# Patient Record
Sex: Male | Born: 1955
Health system: Southern US, Community
[De-identification: ages and names within clinical notes are randomized; demographics above are authoritative.]

## PROBLEM LIST (undated history)

## (undated) DIAGNOSIS — G243 Spasmodic torticollis: Secondary | ICD-10-CM

## (undated) DIAGNOSIS — R7401 Elevation of levels of liver transaminase levels: Secondary | ICD-10-CM

## (undated) DIAGNOSIS — R740 Nonspecific elevation of levels of transaminase and lactic acid dehydrogenase [LDH]: Principal | ICD-10-CM

## (undated) HISTORY — DX: Elevation of levels of liver transaminase levels: R74.01

## (undated) HISTORY — DX: Spasmodic torticollis: G24.3

## (undated) HISTORY — DX: Nonspecific elevation of levels of transaminase and lactic acid dehydrogenase (ldh): R74.0

---
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
## 2012-12-20 ENCOUNTER — Encounter: Payer: Self-pay | Admitting: Neurology

## 2012-12-20 ENCOUNTER — Ambulatory Visit (INDEPENDENT_AMBULATORY_CARE_PROVIDER_SITE_OTHER): Payer: BC Managed Care – PPO | Admitting: Neurology

## 2012-12-20 VITALS — BP 136/75 | HR 67 | Temp 97.9°F | Resp 16 | Ht 69.0 in | Wt 215.8 lb

## 2012-12-20 DIAGNOSIS — G243 Spasmodic torticollis: Secondary | ICD-10-CM

## 2012-12-20 NOTE — Progress Notes (Signed)
Patient seen today for IPSEN 170 Visit 7 End of Study.  No changes in conmeds or Medical History.  Vital signs and labs were done per protocol.  PE and TWSTRS was completed by Dr. Terrace Arabia.     Clinical history: Andrew Bentley reported a history of abnormal neck posture for 10 years, much profounced since 2008, he brought three sets of pictures to establish the time line of his condition, pictures were taken after his [name], dated 2008, 06/04/2008, 2012, all three pictures have consistently demonstrated the significant right neck tilt.  He has received dysport injection, enrolled in open label study. Last injection was Nov 15th 2013.   Physical Exam  General: normal Head: normal cephalic Ears, Nose and Throat: normal oropharyngeal Neck: supple no carotid bruits Respiratory: clear to auscultation bilaterally Cardiovascular: regular rate rhythm Musculoskeletal: normal Skin: normal Trunk: normal  Neurologic Exam  Mental Status: pleasant, awake, alert, cooperative to history, talking, and casual conversation. He has constant mild left turn, mild right tilt, slight retrocollis, mild left shoulder elevation, left lateral shift. TWSTRS scale, see separate report.  Cranial Nerves: CN II-XII pupils were equal round reactive to light.    Extraocular movements were full.   Facial sensation and strength were normal.      Motor: move all four extremities without difficulty. Sensory: Normal to light touch,  Coordination:  There was no dysmetria noticed. Gait and Station: Narrow based and steady,  Reflexes: Deep tendon reflexes: Biceps: 2/2, Brachioradialis: 2/2, Triceps: 2/2, Pateller: 2/2, Achilles: 2/2.  Plantar responses are flexor.   He responded very well to EMG guided dysport injection, no significant side effect, we will proceed further injection through his insurance company

## 2012-12-21 DIAGNOSIS — G243 Spasmodic torticollis: Secondary | ICD-10-CM | POA: Insufficient documentation

## 2013-02-13 ENCOUNTER — Encounter: Payer: Self-pay | Admitting: Neurology

## 2013-02-13 NOTE — Telephone Encounter (Signed)
  Clinical history: Andrew Bentley reported a history of abnormal neck posture for 10 years, much profounced since 2008, he brought three sets of pictures to establish the time line of his condition, pictures were taken after his [name], dated 2008, 06/04/2008, 2012, all three pictures have consistently demonstrated the significant right neck tilt.  Patient was enrolled in IPSEN clinical trial. He received EMG guided this port injection December 29 2012, had more than 15% improvement in forced skull on his followup visit in main fifteens, he had a second injection April 04 2012, third injection October 18th 2013, last injection was January 4th 2014  Physical Exam  Neck: supple no carotid bruits Respiratory: clear to auscultation bilaterally Cardiovascular: regular rate rhythm  Neurologic Exam  Mental Status: pleasant, awake, alert, cooperative to history, talking, and casual conversation.  He has constant moderate left turn, right tilt, mild retrocollis, left shoulder elevation. TWSTRS scale, see separate report. Cranial Nerves: CN II-XII pupils were equal round reactive to light.  Fundi were sharp bilaterally.  Extraocular movements were full.  Visual fields were full on confrontational test.  Facial sensation and strength were normal.  Hearing was intact to finger rubbing bilaterally.  Uvula tongue were midline.    Tongue protrusion into the cheeks strength were normal.  Motor: Normal tone, bulk, and strength. Sensory: Normal to light touch, pinprick, proprioception, and vibratory sensation. Coordination: Normal finger-to-nose, heel-to-shin.  There was no dysmetria noticed. Gait and Station: Narrow based and steady, was able to perform tiptoe, heel, and tandem walking without difficulty.  Romberg sign: Negative Reflexes: Deep tendon reflexes: Biceps: 2/2, Brachioradialis: 2/2, Triceps: 2/2, Pateller: 2/2, Achilles: 2/2.  Plantar responses are flexor.   Assessment and Plan The provided dysport vs  placebo was dissovled into 2 cc of NS.  Under EMG guidance, the injection was placed  at  Right splenius capitis 25 units Left splenius capitis 25 units Left longissimus capitis  25 units Right sternocleidomastoid 25 units.    I have wrote letter of medical necessity for continued EMG guided Botox injection

## 2013-02-20 NOTE — Telephone Encounter (Signed)
Error

## 2013-05-12 ENCOUNTER — Telehealth: Payer: Self-pay | Admitting: Neurology

## 2013-05-12 NOTE — Telephone Encounter (Signed)
error 

## 2013-05-17 NOTE — Telephone Encounter (Signed)
error 

## 2014-05-10 ENCOUNTER — Ambulatory Visit: Payer: BC Managed Care – PPO | Admitting: Internal Medicine

## 2014-06-26 ENCOUNTER — Telehealth: Payer: Self-pay

## 2014-06-26 NOTE — Telephone Encounter (Signed)
Andrew Bentley received a call from patient asking about Botox.  He sees Dr. Terrace ArabiaYan and would like to discuss using Botox or enrolling in a study possibly.  I called patient this morning to get details but had to leave a message on AM. I left my number and asked him to return the call.

## 2014-07-11 ENCOUNTER — Telehealth: Payer: Self-pay

## 2014-07-11 NOTE — Telephone Encounter (Signed)
This patient was denied by his insurance and I appealed it which also resulted in a denial.

## 2014-07-11 NOTE — Telephone Encounter (Signed)
Dr.Yan this patient will need appt. Please see note below. You have to do whole process over . Patient has been denied for the codes you used before. This patient will have to start from scratch. I can put him on your sch. Next available.

## 2014-07-11 NOTE — Telephone Encounter (Signed)
Denese KillingsJanisha, I will redo preauthorization, Let him come after approval  Cervical dystonia, Xeomin 300 units.

## 2014-07-11 NOTE — Telephone Encounter (Signed)
Returned patient's call.  He is interested in receiving Botox for Torticollis.  He states that he was in a research study a couple of years ago and received Botox. He saw Dr. Terrace ArabiaYan at that time.  When the study ended, he wanted to continue with the Botox but was told his insurance, [employer], would not cover this.  He was quoted $1200 for injections (approximately) per quarter. He is wanting to know if we can check with his insurance carrier again to see if this might be covered as it is for a medical, not cosmetic, purpose.  He has the exact same policy as before. He also is interested in possibly participating in a study where Botox is given. I will check with Dr. Terrace ArabiaYan and forward this to the clinical person that handles Botox injections well to look at the insurance.

## 2014-07-30 ENCOUNTER — Telehealth: Payer: Self-pay | Admitting: Neurology

## 2014-07-30 NOTE — Telephone Encounter (Signed)
Andrew Bentley, please see the note from RiversideJanisha, please reschedule patient

## 2014-07-30 NOTE — Telephone Encounter (Signed)
-----   Message from Eugenie BirksJanisha R Cooper sent at 07/30/2014 10:31 AM EST ----- This patient was denied Xeomin Prior Authorization due last appointment being 12/20/12. He would have to be evaluated and resubmitted.

## 2014-07-30 NOTE — Telephone Encounter (Signed)
Patient is not On Dr.Yan's schedule.

## 2014-08-24 ENCOUNTER — Telehealth: Payer: Self-pay | Admitting: Neurology

## 2014-08-24 NOTE — Telephone Encounter (Signed)
Sandy/Tashia:  Please give pt a follow up appt in my next available.

## 2014-08-27 NOTE — Telephone Encounter (Signed)
Patient is scheduled with Dr. Terrace ArabiaYan

## 2014-08-28 ENCOUNTER — Telehealth: Payer: Self-pay | Admitting: Neurology

## 2014-08-28 ENCOUNTER — Encounter: Payer: Self-pay | Admitting: Neurology

## 2014-08-28 ENCOUNTER — Ambulatory Visit (INDEPENDENT_AMBULATORY_CARE_PROVIDER_SITE_OTHER): Payer: BC Managed Care – PPO | Admitting: Neurology

## 2014-08-28 VITALS — BP 135/87 | HR 80 | Ht 69.0 in | Wt 220.0 lb

## 2014-08-28 DIAGNOSIS — G243 Spasmodic torticollis: Secondary | ICD-10-CM

## 2014-08-28 NOTE — Progress Notes (Signed)
PATIENT: Andrew Bentley DOB: [birth year]  HISTORICAL  Andrew Bentley is a 58 years old right-handed Caucasian male, came back for revisit, for potential EMG guided Botox injection for his cervical dystonia  He had a gradual onset neck turning to the left side, abnormal neck posture since 2005, progressively worse since 2008, he was enrolled in Dysport research trial sponsored by [employer] since 2013, he totally received 4 injection, last injection was November 2013, complete the study successfully in April 2014, which is his last office visit.  During the trial, he receive EMG guided dysport injection every 3 months, 500 units, he tolerated the injection very well, showed significant improvement of his neck posturing  He continue to works out regularly, he denies gait difficulty, he complains of left occipital area radiating pain to his left parietal area, he denies radiating pain from his neck to his arms, and hands  REVIEW OF SYSTEMS: Full 14 system review of systems performed and notable only for as above  ALLERGIES: No Known Allergies  HOME MEDICATIONS: No current outpatient prescriptions on file prior to visit.   No current facility-administered medications on file prior to visit.    PAST MEDICAL HISTORY: Past Medical History  Diagnosis Date  . Cervical dystonia     PAST SURGICAL HISTORY: Past Surgical History  Procedure Laterality Date  . No past surgeries      FAMILY HISTORY: Family History  Problem Relation Age of Onset  . Bipolar disorder Mother   . Dementia Mother   . Parkinson's disease Mother   . Memory loss Father     SOCIAL HISTORY:  History   Social History  . Marital Status: Single    Spouse Name: N/A    Number of Children: 0  . Years of Education: college   Occupational History  .      Self Emp.   Social History Main Topics  . Smoking status: Never Smoker   . Smokeless tobacco: Never Used  . Alcohol Use: 1.2 oz/week    2 Glasses of wine per  week     Comment: Red wine with dinner  . Drug Use: No  . Sexual Activity: Not on file   Other Topics Concern  . Not on file   Social History Narrative   Patient lives at home with his wife Andrew Bentley.    Patient works full time [employer].   Education college education.   Right handed.   Caffeine two cups of coffee daily.     PHYSICAL EXAM   Filed Vitals:   08/28/14 1338  BP: 135/87  Pulse: 80  Height: 5\' 9"  (1.753 m)  Weight: 220 lb (99.791 kg)    Not recorded      Body mass index is 32.47 kg/(m^2).   Generalized: In no acute distress  Neck: Supple, no carotid bruits   Cardiac: Regular rate rhythm  Pulmonary: Clear to auscultation bilaterally  Musculoskeletal: No deformity  Neurological examination  Mentation: Alert oriented to time, place, history taking, and causual conversation, he has left neck turning, Almost 90, mild retrocollis mild left tilt,   Cranial nerve II-XII: Pupils were equal round reactive to light. Extraocular movements were full.  Visual field were full on confrontational test. Bilateral fundi were sharp.  Facial sensation and strength were normal. Hearing was intact to finger rubbing bilaterally. Uvula tongue midline.  Head turning and shoulder shrug and were normal and symmetric.Tongue protrusion into cheek strength was normal.  Motor: Normal tone, bulk and strength.  Sensory: Intact to fine touch, pinprick, preserved vibratory sensation, and proprioception at toes.  Coordination: Normal finger to nose, heel-to-shin bilaterally there was no truncal ataxia  Gait: Rising up from seated position without assistance, normal stance, without trunk ataxia, moderate stride, good arm swing, smooth turning, able to perform tiptoe, and heel walking without difficulty.   Romberg signs: Negative  Deep tendon reflexes: Brachioradialis 2/2, biceps 2/2, triceps 2/2, patellar 2/2, Achilles 2/2, plantar responses were flexor bilaterally.   DIAGNOSTIC DATA  (LABS, IMAGING, TESTING) - I reviewed patient records, labs, notes, testing and imaging myself where available.  ASSESSMENT AND PLAN  Andrew Bentley is a 58 y.o. male with long-standing history of cervical dystonia, responded very well to previous EMG guided dysport injection.  Start preauthorization process  Return to clinic in 2 weeks for potential injection     Levert FeinsteinYijun Tywon Niday, M.D. Ph.D.  [facility] [address], [address] [place], [place] [address] [phone]

## 2014-08-28 NOTE — Telephone Encounter (Signed)
Andrew KillingsJanisha, if he is approved for Dysport, please help him with medicine assistant program

## 2014-09-04 ENCOUNTER — Telehealth: Payer: Self-pay | Admitting: Neurology

## 2014-09-04 NOTE — Telephone Encounter (Signed)
Called patient and informed him that his insurance would not cover either medication. Quoted him some different prices with the different medications for out of pocket. He verbalized understanding.

## 2014-09-19 ENCOUNTER — Telehealth: Payer: Self-pay | Admitting: Neurology

## 2014-09-19 NOTE — Telephone Encounter (Signed)
Andrew Bentley with [facility] @ [phone] x [phone], unable to reach patient and needing to verify if shipment for Rx DYSPORT should be forwarded to office.  Please call and advise, if not available please leave physical address if shipment needs to be shipped to office.

## 2014-09-27 ENCOUNTER — Encounter: Payer: Self-pay | Admitting: Internal Medicine

## 2014-09-27 ENCOUNTER — Ambulatory Visit (INDEPENDENT_AMBULATORY_CARE_PROVIDER_SITE_OTHER): Payer: BLUE CROSS/BLUE SHIELD | Admitting: Internal Medicine

## 2014-09-27 VITALS — BP 130/72 | HR 80 | Temp 99.0°F | Resp 10 | Ht 69.0 in | Wt 219.0 lb

## 2014-09-27 DIAGNOSIS — Z131 Encounter for screening for diabetes mellitus: Secondary | ICD-10-CM

## 2014-09-27 DIAGNOSIS — Z13220 Encounter for screening for lipoid disorders: Secondary | ICD-10-CM

## 2014-09-27 DIAGNOSIS — R740 Nonspecific elevation of levels of transaminase and lactic acid dehydrogenase [LDH]: Secondary | ICD-10-CM

## 2014-09-27 DIAGNOSIS — G243 Spasmodic torticollis: Secondary | ICD-10-CM

## 2014-09-27 DIAGNOSIS — R7401 Elevation of levels of liver transaminase levels: Secondary | ICD-10-CM | POA: Insufficient documentation

## 2014-09-27 NOTE — Progress Notes (Signed)
Patient ID: Andrew Bentley, male   DOB: Jun 13, 1956, 59 y.o.   MRN: [id number]   Location:  [facility]  Code Status: full code;  Does not want to talk about advance directives  No Known Allergies  Chief Complaint  Patient presents with  . Establish Care    New patient establish care, history of elevated liver enzyme (not fasting today). Address cervical dystonia    HPI: Patient is a 59 y.o. white male seen in the office today to establish with the practice.    Has cervical dystonia and sees Dr. Terrace Arabia for botox injections for that.  Has spasms on right side, anteriorly and posterior left side of neck.  May now be covered by insurance.  Works out 2-3 times a week.  Has personal trainer--does upper body, lower body, a little cardio.  Runs some on treadmill.  Gets heart rate up to 150.    Last physical, Dr. Lucianne Muss noticed he had elevated liver enzymes--has been seeing him since 57s.  Has 2 glasses red wine per night.  Does like it.  Has tried to cut back to 1, but has not really done this.  Also drinks a lot of coffee--[employer]bucks 2-3 cups per day mostly in the am.    Has cut out sodas.  Only drinks bottled water.    Is taking nerium EHT mind enhancement formula.    Review of Systems:  Review of Systems  Constitutional:       Stable weight for years, but would like to lose  HENT: Negative for congestion and hearing loss.   Eyes: Negative for blurred vision.       Knows he needs to see optometrist; does not wear any corrective lenses  Respiratory: Negative for shortness of breath.   Cardiovascular: Negative for chest pain.  Gastrointestinal: Positive for heartburn. Negative for constipation, blood in stool and melena.  Genitourinary: Negative for dysuria, urgency and frequency.  Musculoskeletal: Positive for neck pain. Negative for falls.  Skin: Positive for rash. Negative for itching.       callousing of knees  Neurological: Positive for  tingling. Negative for dizziness, sensory change, loss of consciousness and headaches.       In fingers in left hand attributed from neck  Endo/Heme/Allergies: Positive for environmental allergies. Does not bruise/bleed easily.       More sinus issues as aging over past couple years  Psychiatric/Behavioral: Negative for depression and memory loss.       Forgets where puts things occasionally     Past Medical History  Diagnosis Date  . Cervical dystonia     Past Surgical History  Procedure Laterality Date  . Tonsillectomy  1963    Social History:   reports that he has never smoked. He has never used smokeless tobacco. He reports that he drinks about 1.2 oz of alcohol per week. He reports that he does not use illicit drugs.  Family History  Problem Relation Age of Onset  . Bipolar disorder Mother   . Dementia Mother   . Parkinson's disease Mother   . Memory loss Father     Medications: Patient's Medications  New Prescriptions   No medications on file  Previous Medications   OVER THE COUNTER MEDICATION    Nerium EHT mind enhancement formula, 1 by mouth daily  Modified Medications   No medications on file  Discontinued Medications   No medications on file     Physical Exam: Filed Vitals:  09/27/14 0850  BP: 130/72  Pulse: 80  Temp: 99 F (37.2 C)  TempSrc: Oral  Resp: 10  Height: 5\' 9"  (1.753 m)  Weight: 219 lb (99.338 kg)  SpO2: 99%  Physical Exam  Constitutional: He is oriented to person, place, and time. He appears well-developed and well-nourished. No distress.  Cardiovascular: Normal rate, regular rhythm, normal heart sounds and intact distal pulses.   Pulmonary/Chest: Effort normal and breath sounds normal. No respiratory distress.  Abdominal: Bowel sounds are normal.  Musculoskeletal:  Right SCM tight;  Left cervical paravertebral muscles also tight and left SCM tender;  Slight decrease in rotation right  Neurological: He is alert and oriented to  person, place, and time.  Skin:  callousing of bilateral knees  Psychiatric: He has a normal mood and affect. Judgment and thought content normal.  fidgety    Labs reviewed: None available--await records (pt completed release)  Assessment/Plan 1. Transaminitis - advised to limit his wine intake (talks about this a lot suggesting more than the 2 glasses/night he admits to) -check labs: - CBC With differential/Platelet; Future - Basic metabolic panel; Future - Hepatic Function Panel; Future  2. Cervical dystonia -keep f/u with Dr. Terrace ArabiaYan for botox injections  3. Screening, lipid - sounds like he enjoys fried foods--is overweight - Lipid panel; Future  4. Encounter for screening examination for impaired glucose regulation and diabetes mellitus - will assess for diabetes - Hemoglobin A1c; Future  Labs/tests ordered:   Orders Placed This Encounter  Procedures  . CBC With differential/Platelet    Standing Status: Future     Number of Occurrences:      Standing Expiration Date: 12/27/2014  . Lipid panel    Standing Status: Future     Number of Occurrences:      Standing Expiration Date: 12/27/2014    Order Specific Question:  Has the patient fasted?    Answer:  Yes  . Hemoglobin A1c    Standing Status: Future     Number of Occurrences:      Standing Expiration Date: 12/27/2014  . Basic metabolic panel    Standing Status: Future     Number of Occurrences:      Standing Expiration Date: 12/27/2014    Order Specific Question:  Has the patient fasted?    Answer:  Yes  . Hepatic Function Panel    Standing Status: Future     Number of Occurrences:      Standing Expiration Date: 12/27/2014   Next appt:  3 mos  Tifany Hirsch L. Daleysa Kristiansen, D.O. Geriatrics [facility] [address] [place], [place] [address] Cell Phone (Mon-Fri 8am-5pm):  [phone] On Call:  [phone] & follow prompts after 5pm & weekends Office Phone:  [phone] Office Fax:   [phone]

## 2014-10-02 ENCOUNTER — Other Ambulatory Visit: Payer: BLUE CROSS/BLUE SHIELD

## 2014-10-02 DIAGNOSIS — Z13220 Encounter for screening for lipoid disorders: Secondary | ICD-10-CM

## 2014-10-02 DIAGNOSIS — R7401 Elevation of levels of liver transaminase levels: Secondary | ICD-10-CM

## 2014-10-02 DIAGNOSIS — Z131 Encounter for screening for diabetes mellitus: Secondary | ICD-10-CM

## 2014-10-02 DIAGNOSIS — R740 Nonspecific elevation of levels of transaminase and lactic acid dehydrogenase [LDH]: Principal | ICD-10-CM

## 2014-10-03 ENCOUNTER — Encounter: Payer: Self-pay | Admitting: Neurology

## 2014-10-03 ENCOUNTER — Ambulatory Visit (INDEPENDENT_AMBULATORY_CARE_PROVIDER_SITE_OTHER): Payer: BLUE CROSS/BLUE SHIELD | Admitting: Neurology

## 2014-10-03 ENCOUNTER — Encounter: Payer: Self-pay | Admitting: *Deleted

## 2014-10-03 DIAGNOSIS — G243 Spasmodic torticollis: Secondary | ICD-10-CM

## 2014-10-03 LAB — CBC WITH DIFFERENTIAL
Basophils Absolute: 0 10*3/uL (ref 0.0–0.2)
Basos: 0 %
Eos: 2 %
Eosinophils Absolute: 0.1 10*3/uL (ref 0.0–0.4)
HCT: 44.4 % (ref 37.5–51.0)
Hemoglobin: 15 g/dL (ref 12.6–17.7)
Immature Grans (Abs): 0 10*3/uL (ref 0.0–0.1)
Immature Granulocytes: 0 %
Lymphocytes Absolute: 1.2 10*3/uL (ref 0.7–3.1)
Lymphs: 25 %
MCH: 31 pg (ref 26.6–33.0)
MCHC: 33.8 g/dL (ref 31.5–35.7)
MCV: 92 fL (ref 79–97)
Monocytes Absolute: 0.4 10*3/uL (ref 0.1–0.9)
Monocytes: 8 %
Neutrophils Absolute: 3.2 10*3/uL (ref 1.4–7.0)
Neutrophils Relative %: 65 %
Platelets: 298 10*3/uL (ref 150–379)
RBC: 4.84 x10E6/uL (ref 4.14–5.80)
RDW: 13.7 % (ref 12.3–15.4)
WBC: 4.9 10*3/uL (ref 3.4–10.8)

## 2014-10-03 LAB — BASIC METABOLIC PANEL
BUN/Creatinine Ratio: 11 (ref 9–20)
BUN: 13 mg/dL (ref 6–24)
CO2: 24 mmol/L (ref 18–29)
Calcium: 9.4 mg/dL (ref 8.7–10.2)
Chloride: 99 mmol/L (ref 97–108)
Creatinine, Ser: 1.14 mg/dL (ref 0.76–1.27)
GFR calc Af Amer: 83 mL/min/{1.73_m2} (ref >59)
GFR calc non Af Amer: 71 mL/min/{1.73_m2} (ref >59)
Glucose: 91 mg/dL (ref 65–99)
Potassium: 4.6 mmol/L (ref 3.5–5.2)
Sodium: 138 mmol/L (ref 134–144)

## 2014-10-03 LAB — LIPID PANEL
Chol/HDL Ratio: 3.1 ratio units (ref 0.0–5.0)
Cholesterol, Total: 172 mg/dL (ref 100–199)
HDL: 55 mg/dL (ref >39)
LDL Calculated: 99 mg/dL (ref 0–99)
Triglycerides: 90 mg/dL (ref 0–149)
VLDL Cholesterol Cal: 18 mg/dL (ref 5–40)

## 2014-10-03 LAB — HEPATIC FUNCTION PANEL
ALT: 14 IU/L (ref 0–44)
AST: 24 IU/L (ref 0–40)
Albumin: 4.2 g/dL (ref 3.5–5.5)
Alkaline Phosphatase: 61 IU/L (ref 39–117)
Bilirubin, Direct: 0.11 mg/dL (ref 0.00–0.40)
Total Bilirubin: 0.4 mg/dL (ref 0.0–1.2)
Total Protein: 6.7 g/dL (ref 6.0–8.5)

## 2014-10-03 LAB — HEMOGLOBIN A1C
Est. average glucose Bld gHb Est-mCnc: 108 mg/dL
Hgb A1c MFr Bld: 5.4 % (ref 4.8–5.6)

## 2014-10-03 MED ORDER — ABOBOTULINUMTOXINA 500 UNITS IM SOLR
500.0000 [IU] | Freq: Once | INTRAMUSCULAR | Status: AC
  Administered 2014-10-03: 500 [IU] via INTRAMUSCULAR

## 2014-10-03 NOTE — Progress Notes (Signed)
PATIENT: Nellie Chevalier DOB: October 21, 1955  HISTORICAL  Eoin Willden is a 59 years old right-handed Caucasian male, came back for revisit, for potential EMG guided Botox injection for his cervical dystonia  He had a gradual onset neck turning to the left side, abnormal neck posture since 2005, progressively worse since 2008, he was enrolled in Dysport research trial sponsored by [employer] since 2013, he totally received 4 injection, last injection was November 2013, complete the study successfully in April 2014, which is his last office visit.   During the trial, he receive EMG guided dysport injection every 3 months, 500 units, he tolerated the injection very well, showed significant improvement of his neck posturing  He continue to works out regularly, he denies gait difficulty, he complains of left occipital area radiating pain to his left parietal area, he denies radiating pain from his neck to his arms, and hands  UPDATE Jan 20th 2016: He signed a consent form, also documented under communication dated October 03 2014, potential side effect explained, he had a bulky right sternocleidomastoid, left posterior cervical paraspinal muscles.  REVIEW OF SYSTEMS: Full 14 system review of systems performed and notable only for as above  ALLERGIES: No Known Allergies  HOME MEDICATIONS: Current Outpatient Prescriptions on File Prior to Visit  Medication Sig Dispense Refill  . OVER THE COUNTER MEDICATION Nerium EHT mind enhancement formula, 1 by mouth daily     No current facility-administered medications on file prior to visit.    PAST MEDICAL HISTORY: Past Medical History  Diagnosis Date  . Cervical dystonia   . Transaminitis     PAST SURGICAL HISTORY: Past Surgical History  Procedure Laterality Date  . Tonsillectomy  1963    FAMILY HISTORY: Family History  Problem Relation Age of Onset  . Bipolar disorder Mother   . Dementia Mother   . Parkinson's disease Mother   . Memory loss  Father     SOCIAL HISTORY:  History   Social History  . Marital Status: Single    Spouse Name: N/A    Number of Children: 0  . Years of Education: college   Occupational History  .      Self [employer].   Social History Main Topics  . Smoking status: Never Smoker   . Smokeless tobacco: Never Used  . Alcohol Use: 1.2 oz/week    2 Glasses of wine per week     Comment: Red wine with dinner  . Drug Use: No  . Sexual Activity: Not on file   Other Topics Concern  . Not on file   Social History Narrative   Patient lives at home (one stories) with his wife [name], married since 1985    Patient works full time Market researcher.  [employer]   Education college education.   Right handed.   Caffeine two cups of coffee daily.   2 dogs, 1 cat   Exercises      PHYSICAL EXAM   Filed Vitals:    Not recorded      Cannot calculate BMI with a height equal to zero.   Generalized: In no acute distress  Neck: Supple, no carotid bruits   Cardiac: Regular rate rhythm  Pulmonary: Clear to auscultation bilaterally  Musculoskeletal: No deformity  Neurological examination  Mentation: Alert oriented to time, place, history taking, and causual conversation, he has left neck turning, Almost 90, mild retrocollis mild left tilt, bulky right sternocleidomastoid, left posterior cervical paraspinal muscles, limited range of turning, especially  when turning to the left side  Cranial nerve II-XII: Pupils were equal round reactive to light. Extraocular movements were full.  Visual field were full on confrontational test. Bilateral fundi were sharp.  Facial sensation and strength were normal. Hearing was intact to finger rubbing bilaterally. Uvula tongue midline.  Head turning and shoulder shrug and were normal and symmetric.Tongue protrusion into cheek strength was normal.  Motor: Normal tone, bulk and strength.  Sensory: Intact to fine touch, pinprick, preserved vibratory sensation, and proprioception  at toes.  Coordination: Normal finger to nose, heel-to-shin bilaterally there was no truncal ataxia  Gait: Rising up from seated position without assistance, normal stance, without trunk ataxia, moderate stride, good arm swing, smooth turning, able to perform tiptoe, and heel walking without difficulty.   Romberg signs: Negative  Deep tendon reflexes: Brachioradialis 2/2, biceps 2/2, triceps 2/2, patellar 2/2, Achilles 2/2, plantar responses were flexor bilaterally.   DIAGNOSTIC DATA (LABS, IMAGING, TESTING) - I reviewed patient records, labs, notes, testing and imaging myself where available.  ASSESSMENT AND PLAN  Patsy LagerBrian Konicek is a 59 y.o. male with long-standing history of cervical dystonia, responded very well to previous EMG guided dysport injection.  Under EMG guidance, 500 units of Dysport were injected (500 units/2.5 cc)  Right sternocleidal mastoid 0.5 cc x3 Left splenius capitis 0.5 cc Left  splenius cervix 0.5 cc  Return to clinic in 3 months  Levert FeinsteinYijun Jani Moronta, M.D. Ph.D.  [facility] [address], [address] [place], [place] [address] [phone]

## 2015-01-14 ENCOUNTER — Ambulatory Visit (INDEPENDENT_AMBULATORY_CARE_PROVIDER_SITE_OTHER): Payer: BLUE CROSS/BLUE SHIELD | Admitting: Internal Medicine

## 2015-01-14 ENCOUNTER — Encounter: Payer: Self-pay | Admitting: Internal Medicine

## 2015-01-14 VITALS — BP 140/88 | HR 69 | Temp 97.9°F | Resp 18 | Ht 69.0 in | Wt 216.8 lb

## 2015-01-14 DIAGNOSIS — G243 Spasmodic torticollis: Secondary | ICD-10-CM | POA: Diagnosis not present

## 2015-01-14 DIAGNOSIS — Z1211 Encounter for screening for malignant neoplasm of colon: Secondary | ICD-10-CM | POA: Diagnosis not present

## 2015-01-14 DIAGNOSIS — E663 Overweight: Secondary | ICD-10-CM

## 2015-01-14 DIAGNOSIS — Z0000 Encounter for general adult medical examination without abnormal findings: Secondary | ICD-10-CM

## 2015-01-14 DIAGNOSIS — R740 Nonspecific elevation of levels of transaminase and lactic acid dehydrogenase [LDH]: Secondary | ICD-10-CM

## 2015-01-14 DIAGNOSIS — R7401 Elevation of levels of liver transaminase levels: Secondary | ICD-10-CM

## 2015-01-14 NOTE — Progress Notes (Signed)
Patient ID: Andrew Bentley, male   DOB: 25-Aug-1956, 59 y.o.   MRN: [id number]   Location:  [facility] [place]  Code Status: full code Goals of Care: Advanced Directive information Does patient have an advance directive?: No, Would patient like information on creating an advanced directive?: Yes - Educational materials given (last visit)   No Known Allergies  Chief Complaint  Patient presents with  . Annual Exam    Annual exam    HPI: Patient is a 59 y.o. white male seen in the office today for his annual exam.    Reviewed Dr. Zannie Cove note from 1/20 when he had his neck injection for cervical dystonia.  Is very aggravating to him.  Botox does help and feels like he needs another series of injections.  $3500 for a visit with the botox.  Insurance not too helpful.    Has lost a few lbs since last check.  All labs were normal this time in January.  No transaminitis any longer.  Has cut back on his wine--did go without wine for 2 days per week.  Will have 1-2 glasses of wine not every night.  Avoids hard liquor.  Only a little beer.    Working out with trainer, but was gone for vacation himself and then with trainer--took a break.    BP right at 140/88 today.    Still eats like crap.  Didn't eat this am.  Had a plate of nachos with a caesar salad.     Hasn't take the supplement lately.    Review of Systems:  Review of Systems  Constitutional: Negative for fever, chills and malaise/fatigue.  HENT: Negative for congestion.   Respiratory: Negative for shortness of breath.   Cardiovascular: Negative for chest pain and leg swelling.  Gastrointestinal: Negative for abdominal pain, diarrhea, constipation, blood in stool and melena.  Genitourinary: Negative for dysuria, urgency and frequency.  Musculoskeletal: Positive for myalgias and neck pain. Negative for falls.  Skin: Negative for rash.  Neurological: Negative for dizziness, loss of consciousness and  weakness.       Cervical dystonia  Endo/Heme/Allergies: Does not bruise/bleed easily.  Psychiatric/Behavioral: Negative for depression and memory loss. The patient is nervous/anxious.      Past Medical History  Diagnosis Date  . Cervical dystonia   . Transaminitis     Past Surgical History  Procedure Laterality Date  . Tonsillectomy  1963    Social History:   reports that he has never smoked. He has never used smokeless tobacco. He reports that he drinks about 1.2 oz of alcohol per week. He reports that he does not use illicit drugs.  Family History  Problem Relation Age of Onset  . Bipolar disorder Mother   . Dementia Mother   . Parkinson's disease Mother   . Memory loss Father     Medications: Patient's Medications  New Prescriptions   No medications on file  Previous Medications   OVER THE COUNTER MEDICATION    Nerium EHT mind enhancement formula, 1 by mouth daily  Modified Medications   No medications on file  Discontinued Medications   No medications on file   Physical Exam: Filed Vitals:   01/14/15 0859  BP: 140/88  Pulse: 69  Temp: 97.9 F (36.6 C)  TempSrc: Oral  Resp: 18  Height:  (1.753 m)  Weight: 216 lb 12.8 oz (98.34 kg)  SpO2: 97%  Physical Exam  Constitutional: He is oriented to person,  place, and time. He appears well-developed and well-nourished. No distress.  HENT:  Head: Normocephalic and atraumatic.  Right Ear: External ear normal.  Left Ear: External ear normal.  Nose: Nose normal.  Mouth/Throat: Oropharynx is clear and moist. No oropharyngeal exudate.  Eyes: Conjunctivae and EOM are normal. Pupils are equal, round, and reactive to light.  Neck: Normal range of motion. Neck supple. No JVD present. No thyromegaly present.  Cardiovascular: Normal rate, regular rhythm, normal heart sounds and intact distal pulses.   Pulmonary/Chest: Effort normal and breath sounds normal. No respiratory distress.  Abdominal: Soft. Bowel sounds are  normal.  Musculoskeletal: Normal range of motion. He exhibits tenderness.  Of paravertebral muscles and trapezius, cannot rotate neck to right  Neurological: He is alert and oriented to person, place, and time.  Skin: Skin is warm and dry.  Psychiatric:  Anxious, pressured speech    Labs reviewed: Basic Metabolic Panel:  Recent Labs  81/19/1401/19/16 0812  NA 138  K 4.6  CL 99  CO2 24  GLUCOSE 91  BUN 13  CREATININE 1.14  CALCIUM 9.4   Liver Function Tests:  Recent Labs  10/02/14 0812  AST 24  ALT 14  ALKPHOS 61  BILITOT 0.4  PROT 6.7  CBC:  Recent Labs  10/02/14 0812  WBC 4.9  NEUTROABS 3.2  HGB 15.0  HCT 44.4  MCV 92  PLT 298   Lipid Panel:  Recent Labs  10/02/14 0812  CHOL 172  HDL 55  LDLCALC 99  TRIG 90  CHOLHDL 3.1   Lab Results  Component Value Date   HGBA1C 5.4 10/02/2014    Assessment/Plan 1. Routine general medical examination at a health care facility -cologuard paperwork done today--will see if it is covered adequately by insurance--if not, will refer for screening cscope -recommended tdap at pharmacy, wants zostavax, but too expensive before 59yo -encouraged diet and exercise, reducing wine intake -should get annual flu shot if not for himself, then for his parents  2. Cervical dystonia -recommended f/u botox with Dr. Terrace ArabiaYan, et al  3. Transaminitis - cont to cut back on alcohol intake--did help him to lose some weight -these were normal when reassessed here - CBC with Differential/Platelet; Future - Comprehensive metabolic panel; Future  4. Screening for colon cancer -again, referred for cologuard, if not, cscope, but prefers cologuard if possible - CBC with Differential/Platelet; Future - Comprehensive metabolic panel; Future  5. Overweight (BMI 25.0-29.9) -encouraged him to resume his running and routine with his trainer b/c bp was up today when he's been off his routine for two weeks (also was on vacation and ate more bad foods  and drank more alcohol) - Comprehensive metabolic panel; Future - Hemoglobin A1c; Future - Lipid panel; Future    Labs/tests ordered:  Orders Placed This Encounter  Procedures  . CBC with Differential/Platelet    Standing Status: Future     Number of Occurrences:      Standing Expiration Date: 01/13/2017  . Comprehensive metabolic panel    Standing Status: Future     Number of Occurrences:      Standing Expiration Date: 01/13/2017    Order Specific Question:  Has the patient fasted?    Answer:  Yes  . Hemoglobin A1c    Standing Status: Future     Number of Occurrences:      Standing Expiration Date: 01/13/2017  . Lipid panel    Standing Status: Future     Number of Occurrences:  Standing Expiration Date: 01/13/2017    Order Specific Question:  Has the patient fasted?    Answer:  Yes   cologuard  Next appt:  1 year for physical with labs before  [place] L. Erynn Vaca, D.O. Geriatrics [facility] [address], [place] [address] Cell Phone (Mon-Fri 8am-5pm):  [phone] On Call:  [phone] & follow prompts after 5pm & weekends Office Phone:  [phone] Office Fax:  [phone]

## 2015-01-14 NOTE — Patient Instructions (Addendum)
Preventive Care for Adults A healthy lifestyle and preventive care can promote health and wellness. Preventive health guidelines for men include the following key practices:  A routine yearly physical is a good way to check with your health care provider about your health and preventative screening. It is a chance to share any concerns and updates on your health and to receive a thorough exam.  Visit your dentist for a routine exam and preventative care every 6 months. Brush your teeth twice a day and floss once a day. Good oral hygiene prevents tooth decay and gum disease.  The frequency of eye exams is based on your age, health, family medical history, use of contact lenses, and other factors. Follow your health care provider's recommendations for frequency of eye exams.  Eat a healthy diet. Foods such as vegetables, fruits, whole grains, low-fat dairy products, and lean protein foods contain the nutrients you need without too many calories. Decrease your intake of foods high in solid fats, added sugars, and salt. Eat the right amount of calories for you.Get information about a proper diet from your health care provider, if necessary.  Regular physical exercise is one of the most important things you can do for your health. Most adults should get at least 150 minutes of moderate-intensity exercise (any activity that increases your heart rate and causes you to sweat) each week. In addition, most adults need muscle-strengthening exercises on 2 or more days a week.  Maintain a healthy weight. The body mass index (BMI) is a screening tool to identify possible weight problems. It provides an estimate of body fat based on height and weight. Your health care provider can find your BMI and can help you achieve or maintain a healthy weight.For adults 20 years and older:  A BMI below 18.5 is considered underweight.  A BMI of 18.5 to 24.9 is normal.  A BMI of 25 to 29.9 is considered overweight.  A BMI  of 30 and above is considered obese.  Maintain normal blood lipids and cholesterol levels by exercising and minimizing your intake of saturated fat. Eat a balanced diet with plenty of fruit and vegetables. Blood tests for lipids and cholesterol should begin at age 50 and be repeated every 5 years. If your lipid or cholesterol levels are high, you are over 50, or you are at high risk for heart disease, you may need your cholesterol levels checked more frequently.Ongoing high lipid and cholesterol levels should be treated with medicines if diet and exercise are not working.  If you smoke, find out from your health care provider how to quit. If you do not use tobacco, do not start.  Lung cancer screening is recommended for adults aged 73-80 years who are at high risk for developing lung cancer because of a history of smoking. A yearly low-dose CT scan of the lungs is recommended for people who have at least a 30-pack-year history of smoking and are a current smoker or have quit within the past 15 years. A pack year of smoking is smoking an average of 1 pack of cigarettes a day for 1 year (for example: 1 pack a day for 30 years or 2 packs a day for 15 years). Yearly screening should continue until the smoker has stopped smoking for at least 15 years. Yearly screening should be stopped for people who develop a health problem that would prevent them from having lung cancer treatment.  If you choose to drink alcohol, do not have more than  2 drinks per day. One drink is considered to be 12 ounces (355 mL) of beer, 5 ounces (148 mL) of wine, or 1.5 ounces (44 mL) of liquor.  Avoid use of street drugs. Do not share needles with anyone. Ask for help if you need support or instructions about stopping the use of drugs.  High blood pressure causes heart disease and increases the risk of stroke. Your blood pressure should be checked at least every 1-2 years. Ongoing high blood pressure should be treated with  medicines, if weight loss and exercise are not effective.  If you are 45-79 years old, ask your health care provider if you should take aspirin to prevent heart disease.  Diabetes screening involves taking a blood sample to check your fasting blood sugar level. This should be done once every 3 years, after age 45, if you are within normal weight and without risk factors for diabetes. Testing should be considered at a younger age or be carried out more frequently if you are overweight and have at least 1 risk factor for diabetes.  Colorectal cancer can be detected and often prevented. Most routine colorectal cancer screening begins at the age of 50 and continues through age 75. However, your health care provider may recommend screening at an earlier age if you have risk factors for colon cancer. On a yearly basis, your health care provider may provide home test kits to check for hidden blood in the stool. Use of a small camera at the end of a tube to directly examine the colon (sigmoidoscopy or colonoscopy) can detect the earliest forms of colorectal cancer. Talk to your health care provider about this at age 50, when routine screening begins. Direct exam of the colon should be repeated every 5-10 years through age 75, unless early forms of precancerous polyps or small growths are found.  People who are at an increased risk for hepatitis B should be screened for this virus. You are considered at high risk for hepatitis B if:  You were born in a country where hepatitis B occurs often. Talk with your health care provider about which countries are considered high risk.  Your parents were born in a high-risk country and you have not received a shot to protect against hepatitis B (hepatitis B vaccine).  You have HIV or AIDS.  You use needles to inject street drugs.  You live with, or have sex with, someone who has hepatitis B.  You are a man who has sex with other men (MSM).  You get hemodialysis  treatment.  You take certain medicines for conditions such as cancer, organ transplantation, and autoimmune conditions.  Hepatitis C blood testing is recommended for all people born from 1945 through 1965 and any individual with known risks for hepatitis C.  Practice safe sex. Use condoms and avoid high-risk sexual practices to reduce the spread of sexually transmitted infections (STIs). STIs include gonorrhea, chlamydia, syphilis, trichomonas, herpes, HPV, and human immunodeficiency virus (HIV). Herpes, HIV, and HPV are viral illnesses that have no cure. They can result in disability, cancer, and death.  If you are at risk of being infected with HIV, it is recommended that you take a prescription medicine daily to prevent HIV infection. This is called preexposure prophylaxis (PrEP). You are considered at risk if:  You are a man who has sex with other men (MSM) and have other risk factors.  You are a heterosexual man, are sexually active, and are at increased risk for HIV infection.    You take drugs by injection.  You are sexually active with a partner who has HIV.  Talk with your health care provider about whether you are at high risk of being infected with HIV. If you choose to begin PrEP, you should first be tested for HIV. You should then be tested every 3 months for as long as you are taking PrEP.  A one-time screening for abdominal aortic aneurysm (AAA) and surgical repair of large AAAs by ultrasound are recommended for men ages 32 to 67 years who are current or former smokers.  Healthy men should no longer receive prostate-specific antigen (PSA) blood tests as part of routine cancer screening. Talk with your health care provider about prostate cancer screening.  Testicular cancer screening is not recommended for adult males who have no symptoms. Screening includes self-exam, a health care provider exam, and other screening tests. Consult with your health care provider about any symptoms  you have or any concerns you have about testicular cancer.  Use sunscreen. Apply sunscreen liberally and repeatedly throughout the day. You should seek shade when your shadow is shorter than you. Protect yourself by wearing long sleeves, pants, a wide-brimmed hat, and sunglasses year round, whenever you are outdoors.  Once a month, do a whole-body skin exam, using a mirror to look at the skin on your back. Tell your health care provider about new moles, moles that have irregular borders, moles that are larger than a pencil eraser, or moles that have changed in shape or color.  Stay current with required vaccines (immunizations).  Influenza vaccine. All adults should be immunized every year.  Tetanus, diphtheria, and acellular pertussis (Td, Tdap) vaccine. An adult who has not previously received Tdap or who does not know his vaccine status should receive 1 dose of Tdap. This initial dose should be followed by tetanus and diphtheria toxoids (Td) booster doses every 10 years. Adults with an unknown or incomplete history of completing a 3-dose immunization series with Td-containing vaccines should begin or complete a primary immunization series including a Tdap dose. Adults should receive a Td booster every 10 years.  Varicella vaccine. An adult without evidence of immunity to varicella should receive 2 doses or a second dose if he has previously received 1 dose.  Human papillomavirus (HPV) vaccine. Males aged 68-21 years who have not received the vaccine previously should receive the 3-dose series. Males aged 22-26 years may be immunized. Immunization is recommended through the age of 6 years for any male who has sex with males and did not get any or all doses earlier. Immunization is recommended for any person with an immunocompromised condition through the age of 49 years if he did not get any or all doses earlier. During the 3-dose series, the second dose should be obtained 4-8 weeks after the first  dose. The third dose should be obtained 24 weeks after the first dose and 16 weeks after the second dose.  Zoster vaccine. One dose is recommended for adults aged 50 years or older unless certain conditions are present.  Measles, mumps, and rubella (MMR) vaccine. Adults born before [birth year] generally are considered immune to measles and mumps. Adults born in [birth year] or later should have 1 or more doses of MMR vaccine unless there is a contraindication to the vaccine or there is laboratory evidence of immunity to each of the three diseases. A routine second dose of MMR vaccine should be obtained at least 28 days after the first dose for students attending postsecondary  schools, health care workers, or international travelers. People who received inactivated measles vaccine or an unknown type of measles vaccine during 1963-1967 should receive 2 doses of MMR vaccine. People who received inactivated mumps vaccine or an unknown type of mumps vaccine before 1979 and are at high risk for mumps infection should consider immunization with 2 doses of MMR vaccine. Unvaccinated health care workers born before 1957 who lack laboratory evidence of measles, mumps, or rubella immunity or laboratory confirmation of disease should consider measles and mumps immunization with 2 doses of MMR vaccine or rubella immunization with 1 dose of MMR vaccine.  Pneumococcal 13-valent conjugate (PCV13) vaccine. When indicated, a person who is uncertain of his immunization history and has no record of immunization should receive the PCV13 vaccine. An adult aged 19 years or older who has certain medical conditions and has not been previously immunized should receive 1 dose of PCV13 vaccine. This PCV13 should be followed with a dose of pneumococcal polysaccharide (PPSV23) vaccine. The PPSV23 vaccine dose should be obtained at least 8 weeks after the dose of PCV13 vaccine. An adult aged 19 years or older who has certain medical conditions and  previously received 1 or more doses of PPSV23 vaccine should receive 1 dose of PCV13. The PCV13 vaccine dose should be obtained 1 or more years after the last PPSV23 vaccine dose.  Pneumococcal polysaccharide (PPSV23) vaccine. When PCV13 is also indicated, PCV13 should be obtained first. All adults aged 65 years and older should be immunized. An adult younger than age 65 years who has certain medical conditions should be immunized. Any person who resides in a nursing home or long-term care facility should be immunized. An adult smoker should be immunized. People with an immunocompromised condition and certain other conditions should receive both PCV13 and PPSV23 vaccines. People with human immunodeficiency virus (HIV) infection should be immunized as soon as possible after diagnosis. Immunization during chemotherapy or radiation therapy should be avoided. Routine use of PPSV23 vaccine is not recommended for American Indians, [place] Natives, or people younger than 65 years unless there are medical conditions that require PPSV23 vaccine. When indicated, people who have unknown immunization and have no record of immunization should receive PPSV23 vaccine. One-time revaccination 5 years after the first dose of PPSV23 is recommended for people aged 19-64 years who have chronic kidney failure, nephrotic syndrome, asplenia, or immunocompromised conditions. People who received 1-2 doses of PPSV23 before age 65 years should receive another dose of PPSV23 vaccine at age 65 years or later if at least 5 years have passed since the previous dose. Doses of PPSV23 are not needed for people immunized with PPSV23 at or after age 65 years.  Meningococcal vaccine. Adults with asplenia or persistent complement component deficiencies should receive 2 doses of quadrivalent meningococcal conjugate (MenACWY-D) vaccine. The doses should be obtained at least 2 months apart. Microbiologists working with certain meningococcal bacteria,  military recruits, people at risk during an outbreak, and people who travel to or live in countries with a high rate of meningitis should be immunized. A first-year college student up through age 21 years who is living in a residence hall should receive a dose if he did not receive a dose on or after his 16th birthday. Adults who have certain high-risk conditions should receive one or more doses of vaccine.  Hepatitis A vaccine. Adults who wish to be protected from this disease, have certain high-risk conditions, work with hepatitis A-infected animals, work in hepatitis A research labs, or   travel to or work in countries with a high rate of hepatitis A should be immunized. Adults who were previously unvaccinated and who anticipate close contact with an international adoptee during the first 60 days after arrival in the [place] from a country with a high rate of hepatitis A should be immunized.  Hepatitis B vaccine. Adults should be immunized if they wish to be protected from this disease, have certain high-risk conditions, may be exposed to blood or other infectious body fluids, are household contacts or sex partners of hepatitis B positive people, are clients or workers in certain care facilities, or travel to or work in countries with a high rate of hepatitis B.  Haemophilus influenzae type b (Hib) vaccine. A previously unvaccinated person with asplenia or sickle cell disease or having a scheduled splenectomy should receive 1 dose of Hib vaccine. Regardless of previous immunization, a recipient of a hematopoietic stem cell transplant should receive a 3-dose series 6-12 months after his successful transplant. Hib vaccine is not recommended for adults with HIV infection. Preventive Service / Frequency Ages 52 to 17  Blood pressure check.** / Every 1 to 2 years.  Lipid and cholesterol check.** / Every 5 years beginning at age 69.  Hepatitis C blood test.** / For any individual with known risks for  hepatitis C.  Skin self-exam. / Monthly.  Influenza vaccine. / Every year.  Tetanus, diphtheria, and acellular pertussis (Tdap, Td) vaccine.** / Consult your health care provider. 1 dose of Td every 10 years.  Varicella vaccine.** / Consult your health care provider.  HPV vaccine. / 3 doses over 6 months, if 72 or younger.  Measles, mumps, rubella (MMR) vaccine.** / You need at least 1 dose of MMR if you were born in 1957 or later. You may also need a second dose.  Pneumococcal 13-valent conjugate (PCV13) vaccine.** / Consult your health care provider.  Pneumococcal polysaccharide (PPSV23) vaccine.** / 1 to 2 doses if you smoke cigarettes or if you have certain conditions.  Meningococcal vaccine.** / 1 dose if you are age 35 to 60 years and a Market researcher living in a residence hall, or have one of several medical conditions. You may also need additional booster doses.  Hepatitis A vaccine.** / Consult your health care provider.  Hepatitis B vaccine.** / Consult your health care provider.  Haemophilus influenzae type b (Hib) vaccine.** / Consult your health care provider. Ages 35 to 8  Blood pressure check.** / Every 1 to 2 years.  Lipid and cholesterol check.** / Every 5 years beginning at age 57.  Lung cancer screening. / Every year if you are aged 44-80 years and have a 30-pack-year history of smoking and currently smoke or have quit within the past 15 years. Yearly screening is stopped once you have quit smoking for at least 15 years or develop a health problem that would prevent you from having lung cancer treatment.  Fecal occult blood test (FOBT) of stool. / Every year beginning at age 55 and continuing until age 73. You may not have to do this test if you get a colonoscopy every 10 years.  Flexible sigmoidoscopy** or colonoscopy.** / Every 5 years for a flexible sigmoidoscopy or every 10 years for a colonoscopy beginning at age 28 and continuing until age  1.  Hepatitis C blood test.** / For all people born from [birth year] through 1965 and any individual with known risks for hepatitis C.  Skin self-exam. / Monthly.  Influenza vaccine. / Every  year.  Tetanus, diphtheria, and acellular pertussis (Tdap/Td) vaccine.** / Consult your health care provider. 1 dose of Td every 10 years.  Varicella vaccine.** / Consult your health care provider.  Zoster vaccine.** / 1 dose for adults aged 84 years or older.  Measles, mumps, rubella (MMR) vaccine.** / You need at least 1 dose of MMR if you were born in 1957 or later. You may also need a second dose.  Pneumococcal 13-valent conjugate (PCV13) vaccine.** / Consult your health care provider.  Pneumococcal polysaccharide (PPSV23) vaccine.** / 1 to 2 doses if you smoke cigarettes or if you have certain conditions.  Meningococcal vaccine.** / Consult your health care provider.  Hepatitis A vaccine.** / Consult your health care provider.  Hepatitis B vaccine.** / Consult your health care provider.  Haemophilus influenzae type b (Hib) vaccine.** / Consult your health care provider. Ages 62 and over  Blood pressure check.** / Every 1 to 2 years.  Lipid and cholesterol check.**/ Every 5 years beginning at age 45.  Lung cancer screening. / Every year if you are aged 50-80 years and have a 30-pack-year history of smoking and currently smoke or have quit within the past 15 years. Yearly screening is stopped once you have quit smoking for at least 15 years or develop a health problem that would prevent you from having lung cancer treatment.  Fecal occult blood test (FOBT) of stool. / Every year beginning at age 38 and continuing until age 7. You may not have to do this test if you get a colonoscopy every 10 years.  Flexible sigmoidoscopy** or colonoscopy.** / Every 5 years for a flexible sigmoidoscopy or every 10 years for a colonoscopy beginning at age 95 and continuing until age 17.  Hepatitis C blood  test.** / For all people born from [birth year] through 1965 and any individual with known risks for hepatitis C.  Abdominal aortic aneurysm (AAA) screening.** / A one-time screening for ages 83 to 23 years who are current or former smokers.  Skin self-exam. / Monthly.  Influenza vaccine. / Every year.  Tetanus, diphtheria, and acellular pertussis (Tdap/Td) vaccine.** / 1 dose of Td every 10 years.  Varicella vaccine.** / Consult your health care provider.  Zoster vaccine.** / 1 dose for adults aged 76 years or older.  Pneumococcal 13-valent conjugate (PCV13) vaccine.** / Consult your health care provider.  Pneumococcal polysaccharide (PPSV23) vaccine.** / 1 dose for all adults aged 13 years and older.  Meningococcal vaccine.** / Consult your health care provider.  Hepatitis A vaccine.** / Consult your health care provider.  Hepatitis B vaccine.** / Consult your health care provider.  Haemophilus influenzae type b (Hib) vaccine.** / Consult your health care provider. **Family history and personal history of risk and conditions may change your health care provider's recommendations. Document Released: 10/27/2001 Document Revised: 09/05/2013 Document Reviewed: 01/26/2011 [facility] Patient Information 2015 [place], [place]. This information is not intended to replace advice given to you by your health care provider. Make sure you discuss any questions you have with your health care provider.  IF COLOGUARD IS NOT APPROVED FOR YOU, WE WILL NEED TO REFER YOU FOR  ROUTINE SCREENING COLONOSCOPY.  ALSO, IF COLOGUARD IS ABNORMAL, YOU MAY STILL REQUIRE A COLONOSCOPY TO ASSESS AND REMOVE POLYPS.

## 2015-01-23 ENCOUNTER — Telehealth: Payer: Self-pay | Admitting: Neurology

## 2015-01-23 NOTE — Telephone Encounter (Signed)
Bernette RedbirdKenny from [facility] [phone]) called and wanted us to note that they have been unable to get in contact with the patient therefor they are putting his DYSPORT INJECTIONS on hold.

## 2015-02-13 ENCOUNTER — Ambulatory Visit (INDEPENDENT_AMBULATORY_CARE_PROVIDER_SITE_OTHER): Payer: BLUE CROSS/BLUE SHIELD | Admitting: Neurology

## 2015-02-13 ENCOUNTER — Encounter: Payer: Self-pay | Admitting: Neurology

## 2015-02-13 ENCOUNTER — Encounter: Payer: Self-pay | Admitting: *Deleted

## 2015-02-13 VITALS — BP 134/87 | HR 67 | Ht 69.0 in | Wt 213.0 lb

## 2015-02-13 DIAGNOSIS — G243 Spasmodic torticollis: Secondary | ICD-10-CM | POA: Diagnosis not present

## 2015-02-13 NOTE — Progress Notes (Signed)
**  Dysport 500units/vial, AVW#[id number]#[id number], Exp 06/2015**mck,rn

## 2015-02-13 NOTE — Progress Notes (Signed)
PATIENT: Patsy LagerBrian Millington DOB: 1956/06/30  HISTORICAL  Patsy LagerBrian Cronin is a 59 years old right-handed Caucasian male, came back for revisit, for potential EMG guided Botox injection for his cervical dystonia  He had a gradual onset neck turning to the left side, abnormal neck posture since 2005, progressively worse since 2008, he was enrolled in Dysport research trial sponsored by [employer] since 2013, he totally received 4 injection, last injection was November 2013, complete the study successfully in April 2014, which is his last office visit.   During the trial, he receive EMG guided dysport injection every 3 months, 500 units, he tolerated the injection very well, showed significant improvement of his neck posturing  He continue to works out regularly, he denies gait difficulty, he complains of left occipital area radiating pain to his left parietal area, he denies radiating pain from his neck to his arms, and hands  UPDATE Jan 20th 2016: He signed a consent form, also documented under communication dated October 03 2014, potential side effect explained, he had a bulky right sternocleidomastoid, left posterior cervical paraspinal muscles.  UPDATE February 13 2015: He responded very well to previous injection in October 03 2014,he received 500 units of Dysport, he can move his neck better, no significant side effect, no neck extension weakness, no swallowing difficulties  He has almost constant head turning to the left side even at the peak benefit of the Dysport injection   REVIEW OF SYSTEMS: Full 14 system review of systems performed and notable only for as above  ALLERGIES: No Known Allergies  HOME MEDICATIONS: No current outpatient prescriptions on file prior to visit.   No current facility-administered medications on file prior to visit.    PAST MEDICAL HISTORY: Past Medical History  Diagnosis Date  . Cervical dystonia   . Transaminitis     PAST SURGICAL HISTORY: Past Surgical  History  Procedure Laterality Date  . Tonsillectomy  1963    FAMILY HISTORY: Family History  Problem Relation Age of Onset  . Bipolar disorder Mother   . Dementia Mother   . Parkinson's disease Mother   . Memory loss Father     SOCIAL HISTORY:  History   Social History  . Marital Status: Single    Spouse Name: N/A  . Number of Children: 0  . Years of Education: college   Occupational History  .      Self Emp.   Social History Main Topics  . Smoking status: Never Smoker   . Smokeless tobacco: Never Used  . Alcohol Use: 1.2 oz/week    2 Glasses of wine per week     Comment: Red wine with dinner  . Drug Use: No  . Sexual Activity: Not on file   Other Topics Concern  . 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   Right handed.   Caffeine two cups of coffee daily.   2 dogs, 1 cat   Exercises      PHYSICAL EXAM   Filed Vitals:   02/13/15 1510  BP: 134/87  Pulse: 67  Height: 5\' 9"  (1.753 m)  Weight: 213 lb (96.616 kg)    Not recorded      Body mass index is 31.44 kg/(m^2).   Generalized: In no acute distress  Neck: Supple, no carotid bruits  Cardiac: Regular rate rhythm  Pulmonary: Clear to auscultation bilaterally  Musculoskeletal: No deformity  Neurological examination  Mentation: Alert oriented to time, place, history taking, and causual conversation, he has left neck turning, Almost 90, mild retrocollis mild left tilt, mild to moderate left shoulder elevation, bulky right sternocleidomastoid, left posterior cervical paraspinal muscles, limited range of turning, especially when turning to the left side  Cranial nerve II-XII: Pupils were equal round reactive to light. Extraocular movements were full.  Visual field were full on confrontational test. Bilateral fundi were sharp.  Facial  sensation and strength were normal. Hearing was intact to finger rubbing bilaterally. Uvula tongue midline.  Head turning and shoulder shrug and were normal and symmetric.Tongue protrusion into cheek strength was normal.  Motor: Normal tone, bulk and strength.  Sensory: Intact to fine touch, pinprick, preserved vibratory sensation, and proprioception at toes.  Coordination: Normal finger to nose, heel-to-shin bilaterally there was no truncal ataxia  Gait: Rising up from seated position without assistance, normal stance, without trunk ataxia, moderate stride, good arm swing, smooth turning, able to perform tiptoe, and heel walking without difficulty.   Romberg signs: Negative  Deep tendon reflexes: Brachioradialis 2/2, biceps 2/2, triceps 2/2, patellar 2/2, Achilles 2/2, plantar responses were flexor bilaterally.   DIAGNOSTIC DATA (LABS, IMAGING, TESTING) - I reviewed patient records, labs, notes, testing and imaging myself where available.  ASSESSMENT AND PLAN  Armonte Tortorella is a 59 y.o. male with long-standing history of cervical dystonia, responded very well to previous EMG guided dysport injection.  Under EMG guidance, 500 x2 =1,000 units of Dysport were injected (500 units/2.5 cc for total of 5.0 cc)  Right sternocleidomastoid 0.5 cc x2=1.0  Left longissimus capitis 0.5 cc  Left inferior oblique capitis 0.5 cc  Left splenius capitis 0.5 ccx2=1.0 Left  splenius cervix 0.5 ccx2=1.0 cc  Left levator scapula  0.5 cc  Left semispinalis 0.5 cc  Return to clinic in 3 months  Levert Feinstein, M.D. Ph.D.  [facility] [address] [place], [place] [address] [phone]

## 2015-05-29 ENCOUNTER — Encounter: Payer: Self-pay | Admitting: Neurology

## 2015-05-29 ENCOUNTER — Ambulatory Visit (INDEPENDENT_AMBULATORY_CARE_PROVIDER_SITE_OTHER): Payer: BLUE CROSS/BLUE SHIELD | Admitting: Neurology

## 2015-05-29 VITALS — BP 128/72 | HR 72 | Ht 69.0 in | Wt 215.0 lb

## 2015-05-29 DIAGNOSIS — G243 Spasmodic torticollis: Secondary | ICD-10-CM | POA: Diagnosis not present

## 2015-05-29 NOTE — Progress Notes (Signed)
PATIENT: Andrew Bentley DOB: 1956-02-09  HISTORICAL  Andrew Bentley is a 59 years old right-handed Caucasian male, came back for revisit, for potential EMG guided Botox injection for his cervical dystonia  He had a gradual onset neck turning to the left side, abnormal neck posture since 2005, progressively worse since 2008, he was enrolled in Dysport research trial sponsored by [employer] since 2013, he totally received 4 injection, last injection was November 2013, complete the study successfully in April 2014, which is his last office visit.   During the trial, he receive EMG guided dysport injection every 3 months, 500 units, he tolerated the injection very well, showed significant improvement of his neck posturing  He continue to works out regularly, he denies gait difficulty, he complains of left occipital area radiating pain to his left parietal area, he denies radiating pain from his neck to his arms, and hands  UPDATE Jan 20th 2016: He signed a consent form, also documented under communication dated October 03 2014, potential side effect explained, he had a bulky right sternocleidomastoid, left posterior cervical paraspinal muscles.  UPDATE February 13 2015: He responded very well to previous injection in October 03 2014,he received 500 units of Dysport, he can move his neck better, no significant side effect, no neck extension weakness, no swallowing difficulties  He has almost constant head turning to the left side even at the peak benefit of the Dysport injection   REVIEW OF SYSTEMS: Full 14 system review of systems performed and notable only for as above  ALLERGIES: No Known Allergies  HOME MEDICATIONS: Current Outpatient Prescriptions on File Prior to Visit  Medication Sig Dispense Refill  . IncobotulinumtoxinA (XEOMIN IM) Inject 1,000 Units into the muscle.     No current facility-administered medications on file prior to visit.    PAST MEDICAL HISTORY: Past Medical History    Diagnosis Date  . Cervical dystonia   . Transaminitis     PAST SURGICAL HISTORY: Past Surgical History  Procedure Laterality Date  . Tonsillectomy  1963    FAMILY HISTORY: Family History  Problem Relation Age of Onset  . Bipolar disorder Mother   . Dementia Mother   . Parkinson's disease Mother   . Memory loss Father     SOCIAL HISTORY:  Social History   Social History  . Marital Status: Single    Spouse Name: N/A  . Number of Children: 0  . Years of Education: college   Occupational History  .      Self Emp.   Social History Main Topics  . Smoking status: Never Smoker   . Smokeless tobacco: Never Used  . Alcohol Use: 1.2 oz/week    2 Glasses of wine per week     Comment: Red wine with dinner  . Drug Use: No  . Sexual Activity: Not on file   Other Topics Concern  . Not on file   Social History Narrative   Patient lives at home (one stories) with his wife [name], married since 1985    Patient works full time Market researcher.  VP Sales   Education college education.   Right handed.   Caffeine two cups of coffee daily.   2 dogs, 1 cat   Exercises      PHYSICAL EXAM   Filed Vitals:   05/29/15 1511  BP: 128/72  Pulse: 72  Height:  (1.753 m)  Weight: 215 lb (97.523 kg)    Not recorded  Body mass index is 31.74 kg/(m^2).   Generalized: In no acute distress  Neck: Supple, no carotid bruits   Cardiac: Regular rate rhythm  Pulmonary: Clear to auscultation bilaterally  Musculoskeletal: No deformity  Neurological examination  Mentation: Alert oriented to time, place, history taking, and causual conversation, he has left neck turning, mild anterocollis, mild left tilt, mild left shoulder elevation, bulky right sternocleidomastoid, left posterior cervical paraspinal muscles, limited range of turning, especially when turning to the left side  Cranial nerve II-XII: Pupils were equal round reactive to light. Extraocular movements were full.   Visual field were full on confrontational test. Bilateral fundi were sharp.  Facial sensation and strength were normal. Hearing was intact to finger rubbing bilaterally. Uvula tongue midline.  Head turning and shoulder shrug and were normal and symmetric.Tongue protrusion into cheek strength was normal.  Motor: Normal tone, bulk and strength.  Sensory: Intact to fine touch, pinprick, preserved vibratory sensation, and proprioception at toes.  Coordination: Normal finger to nose, heel-to-shin bilaterally there was no truncal ataxia  Gait: Rising up from seated position without assistance, normal stance, without trunk ataxia, moderate stride, good arm swing, smooth turning, able to perform tiptoe, and heel walking without difficulty.   Romberg signs: Negative  Deep tendon reflexes: Brachioradialis 2/2, biceps 2/2, triceps 2/2, patellar 2/2, Achilles 2/2, plantar responses were flexor bilaterally.   DIAGNOSTIC DATA (LABS, IMAGING, TESTING) - I reviewed patient records, labs, notes, testing and imaging myself where available.  ASSESSMENT AND PLAN  Andrew Bentley is a 59 y.o. male with long-standing history of cervical dystonia, responded very well to previous EMG guided dysport injection.  Under EMG guidance, 500 x2 =1,000 units of Dysport were injected (500 units/2.5 cc for total of 5.0 cc)  Right sternocleidomastoid 0.5 cc x2=1.0  Left longissimus capitis 0.5 cc  Left inferior oblique capitis 0.5 cc  Right inferior oblique capitis 0.5 cc  Left splenius capitis 0.5 ccx2=1.0 Left  splenius cervix 0.5 ccx2=1.0 cc  Left levator scapula  0.5 cc   Return to clinic in 3 months  Andrew Bentley, M.D. Ph.D.  [facility] [address], [address] [place], [place] [address] [phone]

## 2015-05-29 NOTE — Progress Notes (Signed)
**  Dysport Lot [id number], exp 07/2015, NDC [address]-9, Specialty Pharmacy**mck,rn

## 2015-08-12 ENCOUNTER — Telehealth: Payer: Self-pay | Admitting: Neurology

## 2015-08-12 NOTE — Telephone Encounter (Signed)
[place] with [employer] program inquiring if pt is still in Dysport program. She sts program renewal facts form was faxed 07/15/15 but have not heard back from [facility]. Please call at [phone]

## 2015-08-21 ENCOUNTER — Telehealth: Payer: Self-pay

## 2015-08-21 NOTE — Telephone Encounter (Signed)
Andrew Bentley from [employer] is calling requesting a re-enrollment form so that the patient can obtain dysport through the "[employer]" program. I will provide form to Dr. Terrace ArabiaYan to be filled out.

## 2015-08-26 ENCOUNTER — Telehealth: Payer: Self-pay

## 2015-08-26 NOTE — Telephone Encounter (Signed)
His appt in Aug 28 2015 is for EMG guided dysport injection.  If the dysport can be shipped to our office then, keep appt, if not, his appt should be rescheduled after dysport arrived at our office.

## 2015-08-26 NOTE — Telephone Encounter (Signed)
I spoke to the patient in regards to the Dysport study. I explained to the patient that the study is currently close and that we are no longer enrolling or seeing patients for this study. However, I mentioned to the patient the call that I received on 07DEC2016 regarding the re-enrollment on the "[employer] Care" program. I explained to the patient that this form was given to Dr. Zannie CoveYan's nurse, Lindell SparMichelle Kirkman, and that Dr. Terrace ArabiaYan will submit the form to help him obtain Dysport through the program. The patient also wondered the rationale behind [id number] appointment with Dr. Terrace ArabiaYan. I told him that according to the EPIC notes, it was a follow up visit, and that he should keep it. I told him that I would forward this telephonic phone call to [facility] Inc[facility] and Dr. Terrace ArabiaYan to see whether the form has been submitted and to clarify the rationale behind his follow up appointment. Patient voiced understanding.

## 2015-08-27 NOTE — Telephone Encounter (Addendum)
All insurance has to be submitted Apt. Has been Cx. Called and relayed detail to patient.

## 2015-08-28 ENCOUNTER — Ambulatory Visit: Payer: BLUE CROSS/BLUE SHIELD | Admitting: Neurology

## 2015-08-28 NOTE — Telephone Encounter (Signed)
Called and spoke to patient  He want's to try and get some kind of patient asst. For Dysport because of a higher Bill. I will reach out and try to get him some help and call patient back with update.  Patient was fine with this.

## 2015-08-28 NOTE — Telephone Encounter (Signed)
Called and spoke to OlivarezGeneva at [employer] and she is working on getting patient assist . For the patient . Right now process in benefits investigation . Louie CasaGeneva will then reach out to patient and then call use back with Details . Louie CasaGeneva is aware patient will need his Dysport in December.  [employer] [phone].  Louie CasaGeneva will ask for Danielle/ Annabelle Harmanana.

## 2015-10-22 ENCOUNTER — Telehealth: Payer: Self-pay | Admitting: Neurology

## 2015-10-22 NOTE — Telephone Encounter (Signed)
Called and left message for patient. ISP[employer] has excepted him for patient assistance ISPEN  [phone]  Called [facility] to get him enrolled for Dysport. Spoke to [place] at [employer]. Turn around time for [facility] is 24-48 hours.

## 2015-10-25 NOTE — Telephone Encounter (Signed)
Patient called back regarding BOTOX injections, patient is aware Andrew Bentley is out of the office until Monday. Please call Monday [phone].

## 2015-10-28 NOTE — Telephone Encounter (Signed)
Patient Dysport will be here Wednesday 10/30/2015. Patient has paid off his balance with the office. Danielle please check on his insurance I did Psychologist, occupational.  Patient wants to come this week or the next week. I relayed to him another step would have to take place. First patient understood process.

## 2015-10-29 ENCOUNTER — Telehealth: Payer: Self-pay | Admitting: Neurology

## 2015-10-29 NOTE — Telephone Encounter (Signed)
[employer] called about pts botox. She needs pt prior auth information.  She said she is going to fax it over , [place] at [phone], and if you have any questions to call her. Thank you

## 2015-10-30 ENCOUNTER — Telehealth: Payer: Self-pay | Admitting: Neurology

## 2015-10-30 NOTE — Telephone Encounter (Signed)
Called patient and relayed  [facility] called me back and relayed they could not ship his Dysport because of the balance. I relayed to Patient that he had to settle that balance with them . Patient is enrolled in [employer] Trial but they will only go back 6  months.  Patient is requesting  advise from our  billing department.  Is there advise we can give him . I relayed to him I could send his call to billing [facility]. Because I called [facility] for [facility]. Patient was fine with this .

## 2015-10-30 NOTE — Telephone Encounter (Signed)
error 

## 2015-10-30 NOTE — Telephone Encounter (Signed)
Noted. Information has been sent.

## 2015-12-17 ENCOUNTER — Telehealth: Payer: Self-pay

## 2015-12-17 NOTE — Telephone Encounter (Signed)
Called patient in an attempted to find out if he planned on submitting his Cologuard test. His order for testing is subject to cancellation within 30 days.   Test kit was delivered to patient on 01/16/2015. Scheduled cancellation is on 03/16/2016.

## 2015-12-18 NOTE — Telephone Encounter (Signed)
Attempted to call patient again but had to leave another voicemail in regards to intent to send in Cologuard test.

## 2016-01-02 ENCOUNTER — Other Ambulatory Visit: Payer: Self-pay | Admitting: Neurology

## 2016-01-02 ENCOUNTER — Telehealth: Payer: Self-pay | Admitting: Neurology

## 2016-01-02 NOTE — Telephone Encounter (Signed)
Patient called and spoke to me today . Patient's Balance is clear with [facility] and he need's new RX submitted to [facility] and per Patient's request he would like to come the first part of May . Last injection 2016. Danielle I told him you would call him to schedule Thanks Annabelle Harmanana.

## 2016-01-06 NOTE — Telephone Encounter (Signed)
Called the patient and scheduled him last week, he made me aware that he is having some trouble paying for injections because he is responsible for almost 1400 dollars out of pocket for the dysport.

## 2016-01-15 ENCOUNTER — Other Ambulatory Visit: Payer: BLUE CROSS/BLUE SHIELD

## 2016-01-17 ENCOUNTER — Encounter: Payer: BLUE CROSS/BLUE SHIELD | Admitting: Internal Medicine

## 2016-01-21 DIAGNOSIS — G243 Spasmodic torticollis: Secondary | ICD-10-CM | POA: Diagnosis not present

## 2016-01-21 NOTE — Telephone Encounter (Signed)
Called the pharmacy to schedule delivery for medication. They stated that they were waiting on patient consent. Called the patient and informed him that he needed to call the SP and gave him their phone number. He stated that he would.

## 2016-01-21 NOTE — Telephone Encounter (Signed)
Spoke with Andrew Bentley & he wanted a call back to make sure everything was still good to go for his Thursday appointment.

## 2016-01-22 NOTE — Telephone Encounter (Signed)
Called Andrew Bentley and told him that the medication had come in and he was ready to go.

## 2016-01-23 ENCOUNTER — Ambulatory Visit (INDEPENDENT_AMBULATORY_CARE_PROVIDER_SITE_OTHER): Payer: BLUE CROSS/BLUE SHIELD | Admitting: Neurology

## 2016-01-23 ENCOUNTER — Encounter: Payer: Self-pay | Admitting: Neurology

## 2016-01-23 VITALS — BP 136/84 | HR 73

## 2016-01-23 DIAGNOSIS — G243 Spasmodic torticollis: Secondary | ICD-10-CM | POA: Diagnosis not present

## 2016-01-23 NOTE — Progress Notes (Signed)
PATIENT: Andrew Bentley DOB: 25-Jun-1956  HISTORICAL  Andrew Bentley is a 60 years old right-handed Caucasian male, came back for revisit, for potential EMG guided Botox injection for his cervical dystonia  He had a gradual onset neck turning to the left side, abnormal neck posture since 2005, progressively worse since 2008, he was enrolled in Dysport research trial sponsored by [employer] since 2013, he totally received 4 injection, last injection was November 2013, complete the study successfully in April 2014, which is his last office visit.   During the trial, he receive EMG guided dysport injection every 3 months, 500 units, he tolerated the injection very well, showed significant improvement of his neck posturing  He continue to works out regularly, he denies gait difficulty, he complains of left occipital area radiating pain to his left parietal area, he denies radiating pain from his neck to his arms, and hands  UPDATE Jan 20th 2016: He signed a consent form, also documented under communication dated October 03 2014, potential side effect explained, he had a bulky right sternocleidomastoid, left posterior cervical paraspinal muscles.  UPDATE February 13 2015: He responded very well to previous injection in October 03 2014,he received 500 units of Dysport, he can move his neck better, no significant side effect, no neck extension weakness, no swallowing difficulties  He has almost constant head turning to the left side even at the peak benefit of the Dysport injection  UPDATE May 11th 2017: Last injection was in Sept 2016, he received Dysport 500 units times 2, he had transient dysphagia, and dysarthria lasting for couple weeks, otherwise injection was very helpful, he continued to exercise regularly, receiving chiropractic adjustment regularly  REVIEW OF SYSTEMS: Full 14 system review of systems performed and notable only for as above  ALLERGIES: No Known Allergies  HOME  MEDICATIONS: Current Outpatient Prescriptions on File Prior to Visit  Medication Sig Dispense Refill  . IncobotulinumtoxinA (XEOMIN IM) Inject 1,000 Units into the muscle.     No current facility-administered medications on file prior to visit.    PAST MEDICAL HISTORY: Past Medical History  Diagnosis Date  . Cervical dystonia   . Transaminitis     PAST SURGICAL HISTORY: Past Surgical History  Procedure Laterality Date  . Tonsillectomy  1963    FAMILY HISTORY: Family History  Problem Relation Age of Onset  . Bipolar disorder Mother   . Dementia Mother   . Parkinson's disease Mother   . Memory loss Father     SOCIAL HISTORY:  Social History   Social History  . Marital Status: Single    Spouse Name: N/A  . Number of Children: 0  . Years of Education: college   Occupational History  .      Self Emp.   Social History Main Topics  . Smoking status: Never Smoker   . Smokeless tobacco: Never Used  . Alcohol Use: 1.2 oz/week    2 Glasses of wine per week     Comment: Red wine with dinner  . Drug Use: No  . Sexual Activity: Not on file   Other Topics Concern  . 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   Right handed.   Caffeine two cups of coffee daily.   2 dogs, 1 cat   Exercises  PHYSICAL EXAM   Filed Vitals:   01/23/16 1458  BP: 136/84  Pulse: 73    Not recorded      There is no weight on file to calculate BMI.   Generalized: In no acute distress  Neck: Supple, no carotid bruits   Cardiac: Regular rate rhythm  Pulmonary: Clear to auscultation bilaterally  Musculoskeletal: No deformity  Neurological examination  Mentation: Alert oriented to time, place, history taking, and causual conversation, he has severe left neck turning, mild anterocollis, mild left tilt, mild left  shoulder elevation, bulky right sternocleidomastoid, left posterior cervical paraspinal muscles, difficulty turning to the right side  Cranial nerve II-XII: Pupils were equal round reactive to light. Extraocular movements were full.  Visual field were full on confrontational test. Bilateral fundi were sharp.  Facial sensation and strength were normal. Hearing was intact to finger rubbing bilaterally. Uvula tongue midline.  Head turning and shoulder shrug and were normal and symmetric.Tongue protrusion into cheek strength was normal.  Motor: Normal tone, bulk and strength.  Sensory: Intact to fine touch, pinprick, preserved vibratory sensation, and proprioception at toes.  Coordination: Normal finger to nose, heel-to-shin bilaterally there was no truncal ataxia  Gait: Rising up from seated position without assistance, normal stance, without trunk ataxia, moderate stride, good arm swing, smooth turning, able to perform tiptoe, and heel walking without difficulty.   Romberg signs: Negative  Deep tendon reflexes: Brachioradialis 2/2, biceps 2/2, triceps 2/2, patellar 2/2, Achilles 2/2, plantar responses were flexor bilaterally.   DIAGNOSTIC DATA (LABS, IMAGING, TESTING) - I reviewed patient records, labs, notes, testing and imaging myself where available.  ASSESSMENT AND PLAN  Andrew Bentley is a 60 y.o. male with long-standing history of cervical dystonia, responded very well to previous EMG guided dysport injection.  Under EMG guidance, 500 x2 =1,000 units of Dysport were injected (500 units/2.5 cc for total of 5.0 cc)  Right sternocleidomastoid 0.5 cc x2=1.0 cc   Left longissimus capitis 0. 5 cc  Left semispinalis 0.5 cc Right inferior oblique capitis 0.5 cc  Left splenius capitis 0.5 ccx2=1.0 Left  splenius cervix 0.5 ccx2=1.0 cc  Left levator scapula  0.5 cc   Return to clinic in 3 months  Levert Feinstein, M.D. Ph.D.  [facility] [address] [place], [place] [address] [phone]

## 2016-01-23 NOTE — Progress Notes (Signed)
**  Dysport 500 units x 2 vials, Lot [id number], Exp 08/13/2016, NDC [address]-915054-05[address]-1, specialty pharmacy.**mck,rn.

## 2016-04-07 NOTE — Addendum Note (Signed)
Addended by: Sarayah Bacchi A on: 04/07/2016 04:15 PM   Modules accepted: Orders  

## 2016-04-15 ENCOUNTER — Other Ambulatory Visit: Payer: BLUE CROSS/BLUE SHIELD

## 2016-04-20 ENCOUNTER — Encounter: Payer: BLUE CROSS/BLUE SHIELD | Admitting: Internal Medicine

## 2016-04-28 ENCOUNTER — Other Ambulatory Visit: Payer: BLUE CROSS/BLUE SHIELD

## 2016-04-28 DIAGNOSIS — G243 Spasmodic torticollis: Secondary | ICD-10-CM | POA: Diagnosis not present

## 2016-04-29 ENCOUNTER — Encounter: Payer: Self-pay | Admitting: Neurology

## 2016-04-29 ENCOUNTER — Ambulatory Visit (INDEPENDENT_AMBULATORY_CARE_PROVIDER_SITE_OTHER): Payer: BLUE CROSS/BLUE SHIELD | Admitting: Neurology

## 2016-04-29 VITALS — BP 134/73 | HR 73 | Ht 69.0 in

## 2016-04-29 DIAGNOSIS — G243 Spasmodic torticollis: Secondary | ICD-10-CM | POA: Diagnosis not present

## 2016-04-29 NOTE — Progress Notes (Signed)
**  Dysport 500 units x 2 vials, Lot [id number], Exp 06/13/2016, NDC [address]-215054-0500-9, sample supply.**mck,rn.

## 2016-04-29 NOTE — Progress Notes (Signed)
PATIENT: Andrew Bentley DOB: Oct 09, 1955  HISTORICAL  Andrew Bentley is a 60 years old right-handed Caucasian male, came back for revisit, for potential EMG guided Botox injection for his cervical dystonia  He had a gradual onset neck turning to the left side, abnormal neck posture since 2005, progressively worse since 2008, he was enrolled in Dysport research trial sponsored by [employer] since 2013, he totally received 4 injection, last injection was November 2013, complete the study successfully in April 2014, which is his last office visit.   During the trial, he receive EMG guided dysport injection every 3 months, 500 units, he tolerated the injection very well, showed significant improvement of his neck posturing  He continue to works out regularly, he denies gait difficulty, he complains of left occipital area radiating pain to his left parietal area, he denies radiating pain from his neck to his arms, and hands  UPDATE Jan 20th 2016: He signed a consent form, also documented under communication dated October 03 2014, potential side effect explained, he had a bulky right sternocleidomastoid, left posterior cervical paraspinal muscles.  UPDATE February 13 2015: He responded very well to previous injection in October 03 2014,he received 500 units of Dysport, he can move his neck better, no significant side effect, no neck extension weakness, no swallowing difficulties  He has almost constant head turning to the left side even at the peak benefit of the Dysport injection  UPDATE May 11th 2017: Last injection was in Sept 2016, he received Dysport 500 units times 2, he had transient dysphagia, and dysarthria lasting for couple weeks, otherwise injection was very helpful, he continued to exercise regularly, receiving chiropractic adjustment regularly  Update April 29 2016:  He responded very well to previous injection Dysport 500 unitsx2 in May 2017, did reported side effect of mild transient  swallowing difficulty, weak voice, difficulty extending his neck,  REVIEW OF SYSTEMS: Full 14 system review of systems performed and notable only for as above  ALLERGIES: No Known Allergies  HOME MEDICATIONS: Current Outpatient Prescriptions on File Prior to Visit  Medication Sig Dispense Refill  . IncobotulinumtoxinA (XEOMIN IM) Inject 1,000 Units into the muscle.     No current facility-administered medications on file prior to visit.     PAST MEDICAL HISTORY: Past Medical History:  Diagnosis Date  . Cervical dystonia   . Transaminitis     PAST SURGICAL HISTORY: Past Surgical History:  Procedure Laterality Date  . TONSILLECTOMY  1963    FAMILY HISTORY: Family History  Problem Relation Age of Onset  . Bipolar disorder Mother   . Dementia Mother   . Parkinson's disease Mother   . Memory loss Father     SOCIAL HISTORY:  Social History   Social History  . Marital status: Married    Spouse name: N/A  . Number of children: 0  . Years of education: college   Occupational History  .      Self Emp.   Social History Main Topics  . Smoking status: Never Smoker  . Smokeless tobacco: Never Used  . Alcohol use 1.2 oz/week    2 Glasses of wine per week     Comment: Red wine with dinner  . Drug use: No  . Sexual activity: Not on file   Other Topics Concern  . 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   Right handed.   Caffeine two cups of coffee daily.   2 dogs, 1 cat   Exercises      PHYSICAL EXAM   Vitals:   04/29/16 1605  BP: 134/73  Pulse: 73  Height: 5\' 9"  (1.753 m)    Not recorded      There is no height or weight on file to calculate BMI.   Generalized: In no acute distress  Neck: Supple, no carotid bruits   Cardiac: Regular rate rhythm  Pulmonary: Clear to auscultation  bilaterally  Musculoskeletal: No deformity  Neurological examination  Mentation: Alert oriented to time, place, history taking, and causual conversation, he has moderate left neck turning, mild anterocollis, mild right tilt, mild left shoulder elevation, bulky right sternocleidomastoid, left posterior cervical paraspinal muscles, difficulty turning to the right side  Cranial nerve II-XII: Pupils were equal round reactive to light. Extraocular movements were full.  Visual field were full on confrontational test. Bilateral fundi were sharp.  Facial sensation and strength were normal. Hearing was intact to finger rubbing bilaterally. Uvula tongue midline.  Head turning and shoulder shrug and were normal and symmetric.Tongue protrusion into cheek strength was normal.  Motor: Normal tone, bulk and strength.  Sensory: Intact to fine touch, pinprick, preserved vibratory sensation, and proprioception at toes.  Coordination: Normal finger to nose, heel-to-shin bilaterally there was no truncal ataxia  Gait: Rising up from seated position without assistance, normal stance, without trunk ataxia, moderate stride, good arm swing, smooth turning, able to perform tiptoe, and heel walking without difficulty.   Romberg signs: Negative  Deep tendon reflexes: Brachioradialis 2/2, biceps 2/2, triceps 2/2, patellar 2/2, Achilles 2/2, plantar responses were flexor bilaterally.   DIAGNOSTIC DATA (LABS, IMAGING, TESTING) - I reviewed patient records, labs, notes, testing and imaging myself where available.  ASSESSMENT AND PLAN  Andrew Bentley is a 60 y.o. male with long-standing history of cervical dystonia, responded very well to previous EMG guided dysport injection.  Under EMG guidance, 500 x2 =1,000 units of Dysport were injected (500 units/2.5 cc for total of 5.0 cc)  Right sternocleidomastoid 0.5cc  Left longissimus capitis 0. 5 cc x2=1.0 cc Left semispinalis 0.5 cc Right inferior oblique capitis 0.5  cc  Left splenius capitis 0.5 ccx2=1.0 Left  splenius cervix 0.5 ccx2=1.0 cc  Left levator scapula  0.5 cc   Return to clinic in 3 months  Levert FeinsteinYijun Shigeo Baugh, M.D. Ph.D.  [facility] [address], [address] [place], [place] [address] [phone]

## 2016-05-04 ENCOUNTER — Other Ambulatory Visit: Payer: BLUE CROSS/BLUE SHIELD

## 2016-05-08 ENCOUNTER — Encounter: Payer: BLUE CROSS/BLUE SHIELD | Admitting: Internal Medicine

## 2016-06-24 ENCOUNTER — Other Ambulatory Visit: Payer: Self-pay

## 2016-06-26 ENCOUNTER — Encounter: Payer: Self-pay | Admitting: Internal Medicine

## 2016-07-28 ENCOUNTER — Other Ambulatory Visit: Payer: Self-pay

## 2016-07-29 ENCOUNTER — Other Ambulatory Visit: Payer: BLUE CROSS/BLUE SHIELD

## 2016-07-29 DIAGNOSIS — E663 Overweight: Secondary | ICD-10-CM

## 2016-07-29 DIAGNOSIS — R7401 Elevation of levels of liver transaminase levels: Secondary | ICD-10-CM

## 2016-07-29 DIAGNOSIS — Z1211 Encounter for screening for malignant neoplasm of colon: Secondary | ICD-10-CM

## 2016-07-29 DIAGNOSIS — R740 Nonspecific elevation of levels of transaminase and lactic acid dehydrogenase [LDH]: Principal | ICD-10-CM

## 2016-07-29 LAB — CBC WITH DIFFERENTIAL/PLATELET
Basophils Absolute: 47 cells/uL (ref 0–200)
Basophils Relative: 1 %
Eosinophils Absolute: 94 cells/uL (ref 15–500)
Eosinophils Relative: 2 %
HCT: 44.4 % (ref 38.5–50.0)
Hemoglobin: 14.9 g/dL (ref 13.2–17.1)
Lymphocytes Relative: 22 %
Lymphs Abs: 1034 cells/uL (ref 850–3900)
MCH: 30.5 pg (ref 27.0–33.0)
MCHC: 33.6 g/dL (ref 32.0–36.0)
MCV: 91 fL (ref 80.0–100.0)
MPV: 10.8 fL (ref 7.5–12.5)
Monocytes Absolute: 470 cells/uL (ref 200–950)
Monocytes Relative: 10 %
Neutro Abs: 3055 cells/uL (ref 1500–7800)
Neutrophils Relative %: 65 %
Platelets: 314 10*3/uL (ref 140–400)
RBC: 4.88 MIL/uL (ref 4.20–5.80)
RDW: 13.7 % (ref 11.0–15.0)
WBC: 4.7 10*3/uL (ref 3.8–10.8)

## 2016-07-29 LAB — LIPID PANEL
Cholesterol: 192 mg/dL (ref <200)
HDL: 57 mg/dL (ref >40)
LDL Cholesterol: 116 mg/dL — ABNORMAL HIGH (ref <100)
Total CHOL/HDL Ratio: 3.4 Ratio (ref <5.0)
Triglycerides: 97 mg/dL (ref <150)
VLDL: 19 mg/dL (ref <30)

## 2016-07-29 LAB — COMPREHENSIVE METABOLIC PANEL
ALT: 21 U/L (ref 9–46)
AST: 27 U/L (ref 10–35)
Albumin: 4.4 g/dL (ref 3.6–5.1)
Alkaline Phosphatase: 47 U/L (ref 40–115)
BUN: 15 mg/dL (ref 7–25)
CO2: 28 mmol/L (ref 20–31)
Calcium: 9.8 mg/dL (ref 8.6–10.3)
Chloride: 101 mmol/L (ref 98–110)
Creat: 1.08 mg/dL (ref 0.70–1.33)
Glucose, Bld: 98 mg/dL (ref 65–99)
Potassium: 4.2 mmol/L (ref 3.5–5.3)
Sodium: 138 mmol/L (ref 135–146)
Total Bilirubin: 1.1 mg/dL (ref 0.2–1.2)
Total Protein: 6.9 g/dL (ref 6.1–8.1)

## 2016-07-30 ENCOUNTER — Encounter: Payer: Self-pay | Admitting: Internal Medicine

## 2016-07-30 ENCOUNTER — Ambulatory Visit (INDEPENDENT_AMBULATORY_CARE_PROVIDER_SITE_OTHER): Payer: BLUE CROSS/BLUE SHIELD | Admitting: Internal Medicine

## 2016-07-30 VITALS — BP 140/80 | HR 74 | Temp 98.5°F | Ht 68.25 in | Wt 212.0 lb

## 2016-07-30 DIAGNOSIS — G243 Spasmodic torticollis: Secondary | ICD-10-CM

## 2016-07-30 DIAGNOSIS — E7800 Pure hypercholesterolemia, unspecified: Secondary | ICD-10-CM | POA: Diagnosis not present

## 2016-07-30 DIAGNOSIS — E663 Overweight: Secondary | ICD-10-CM | POA: Diagnosis not present

## 2016-07-30 DIAGNOSIS — R7401 Elevation of levels of liver transaminase levels: Secondary | ICD-10-CM

## 2016-07-30 DIAGNOSIS — Z0000 Encounter for general adult medical examination without abnormal findings: Secondary | ICD-10-CM | POA: Diagnosis not present

## 2016-07-30 DIAGNOSIS — Z1211 Encounter for screening for malignant neoplasm of colon: Secondary | ICD-10-CM | POA: Diagnosis not present

## 2016-07-30 DIAGNOSIS — R740 Nonspecific elevation of levels of transaminase and lactic acid dehydrogenase [LDH]: Secondary | ICD-10-CM | POA: Diagnosis not present

## 2016-07-30 LAB — HEMOGLOBIN A1C
Hgb A1c MFr Bld: 5 % (ref <5.7)
Mean Plasma Glucose: 97 mg/dL

## 2016-07-30 NOTE — Progress Notes (Signed)
Provider:  Gwenith Spitziffany L. Renato Gailseed, D.O., C.M.D. Location:   [facility]   Place of Service:   clinic  Previous PCP: Bufford SpikesEED, Yechezkel Fertig, DO Patient Care Team: Kermit Baloiffany L Edin Skarda, DO as PCP - General (Geriatric Medicine) Levert FeinsteinYijun Yan, MD as Consulting Physician (Neurology)  Extended Emergency Contact Information Primary Emergency Contact: [address] [facility] Center[facility],Maryann Address: [address]          [place], [place] [address] [place] Mobile Phone: [phone] Relation: Spouse Secondary Emergency Contact: Regino BellowGuinn,Larry Address: [address]          [place], [place] [address] [place] Home Phone: [phone] Work Phone: [phone] Mobile Phone: [phone] Relation: Father  Code Status: full code Goals of Care: Advanced Directive information Advanced Directives 07/30/2016  Does patient have an advance directive? No  Does patient want to make changes to advanced directive? -  Would patient like information on creating an advanced directive? No - patient declined information    Chief Complaint  Patient presents with  . Annual Exam    HPI: Patient is a 60 y.o. male seen today for an annual physical exam.   BP up.  They are seeing an attorney in reference to his parents.    Still working out 3 times per week with trainer, trying to eat healthy, still drinking on average 2 glasses of red wine per night.  No longer drinking much beer.  Does eat fried food some.  Knows he needs to eat better.  [place] [facility] [place] and he had noodles and pan-fried dessert.   Discussed cutting back on cheese, butter, fried.  Sees Dr. Terrace ArabiaYan next month.  Gets financial assistance for the botox injection.  When he had bilateral injections beneath ears, he had some swallowing difficulty and singing voice affected.  She moved the injections posteriorly the last time and he had fewer side effects.  He was supposed to do cologuard last year and never did.    Past Medical History:  Diagnosis Date    . Cervical dystonia   . Transaminitis    Past Surgical History:  Procedure Laterality Date  . TONSILLECTOMY  1963    reports that he has never smoked. He has never used smokeless tobacco. He reports that he drinks about 1.2 oz of alcohol per week . He reports that he does not use drugs.  Functional Status Survey:  independent in all ADLs  Family History  Problem Relation Age of Onset  . Bipolar disorder Mother   . Dementia Mother   . Parkinson's disease Mother   . Memory loss Father     Health Maintenance  Topic Date Due  . Hepatitis C Screening  [birth year]  . HIV Screening  12/28/1972  . COLONOSCOPY  12/29/2007  . INFLUENZA VACCINE  12/12/2016 (Originally 04/14/2016)  . TETANUS/TDAP  07/30/2026 (Originally 12/28/1976)    No Known Allergies    Medication List       Accurate as of 07/30/16  9:07 AM. Always use your most recent med list.          XEOMIN IM Inject 1,000 Units into the muscle.       Review of Systems  Constitutional: Negative for chills, fever and malaise/fatigue.  HENT: Negative for hearing loss.   Eyes: Negative for blurred vision.       Wearing reading glasses now and still needs to get eye exam  Respiratory: Negative for cough and shortness of breath.   Cardiovascular: Negative for chest pain and  palpitations.  Gastrointestinal: Negative for abdominal pain, blood in stool, constipation, diarrhea and melena.  Genitourinary: Negative for dysuria.  Musculoskeletal: Positive for myalgias and neck pain.       Improved cervical dystonia with botox  Skin: Negative for itching and rash.  Neurological: Negative for dizziness, loss of consciousness and weakness.  Endo/Heme/Allergies: Does not bruise/bleed easily.  Psychiatric/Behavioral: Negative for depression and memory loss. The patient is not nervous/anxious and does not have insomnia.     Vitals:   07/30/16 0858  BP: 140/80  Pulse: 74  Temp: 98.5 F (36.9 C)  TempSrc: Oral  SpO2: 98%   Weight: 212 lb (96.2 kg)  Height: 5' 8.25" (1.734 m)   Body mass index is 32 kg/m. Physical Exam  Constitutional: He is oriented to person, place, and time. He appears well-developed and well-nourished. No distress.  HENT:  Head: Normocephalic and atraumatic.  Right Ear: External ear normal.  Left Ear: External ear normal.  Nose: Nose normal.  Mouth/Throat: Oropharynx is clear and moist. No oropharyngeal exudate.  Eyes: Conjunctivae and EOM are normal. Pupils are equal, round, and reactive to light.  Neck: Neck supple. No JVD present.  Cardiovascular: Normal rate, regular rhythm, normal heart sounds and intact distal pulses.   Pulmonary/Chest: Effort normal and breath sounds normal.  Abdominal: Soft. Bowel sounds are normal. He exhibits no distension and no mass. There is no tenderness. There is no rebound and no guarding. No hernia.  Musculoskeletal: Normal range of motion. He exhibits no edema or tenderness.  See neuro  Lymphadenopathy:    He has no cervical adenopathy.  Neurological: He is alert and oriented to person, place, and time. He exhibits abnormal muscle tone.  Neck slightly turned to left with very mild tremor when he tries to straighten his head  Skin: Skin is warm and dry.  Psychiatric: He has a normal mood and affect.    Labs reviewed: Basic Metabolic Panel:  Recent Labs  16/06/9610/15/17 0817  NA 138  K 4.2  CL 101  CO2 28  GLUCOSE 98  BUN 15  CREATININE 1.08  CALCIUM 9.8   Liver Function Tests:  Recent Labs  07/29/16 0817  AST 27  ALT 21  ALKPHOS 47  BILITOT 1.1  PROT 6.9  ALBUMIN 4.4   No results for input(s): LIPASE, AMYLASE in the last 8760 hours. No results for input(s): AMMONIA in the last 8760 hours. CBC:  Recent Labs  07/29/16 0817  WBC 4.7  NEUTROABS 3,055  HGB 14.9  HCT 44.4  MCV 91.0  PLT 314   Cardiac Enzymes: No results for input(s): CKTOTAL, CKMB, CKMBINDEX, TROPONINI in the last 8760 hours. BNP: Invalid input(s):  POCBNP Lab Results  Component Value Date   HGBA1C 5.0 07/29/2016  Notes from Dr. Terrace ArabiaYan reviewed Labs reviewed with resident  Assessment/Plan 1. Annual physical exam -counseled on dietary changes and decreased wine intake some -continues to exercise regularly   2. Transaminitis - cut down on wine a bit - f/u lab before next appt - Hepatic Function Panel; Future  3. Cervical dystonia -improved with regular botox injections  4. Overweight (BMI 25.0-29.9) - based on BMI, but does exercise regularly, trying to eat better and drink a little less alcohol - Lipid panel; Future  5. Screening for colon cancer -agreed to cologuard; he also said that if it's not covered by insurance, he'll pay for it--I had advised him to call and find out  6. Pure hypercholesterolemia - LDL trending up--reduce cheese  intake - Lipid panel; Future  Labs/tests ordered:   Orders Placed This Encounter  Procedures  . Lipid panel    Standing Status:   Future    Standing Expiration Date:   07/30/2017    Order Specific Question:   Has the patient fasted?    Answer:   Yes  . Hepatic Function Panel    Standing Status:   Future    Standing Expiration Date:   07/30/2017    Anely Spiewak L. Xandrea Clarey, D.O. Geriatrics [facility] [address] [place], [place] [address] Cell Phone (Mon-Fri 8am-5pm):  [phone] On Call:  [phone] & follow prompts after 5pm & weekends Office Phone:  [phone] Office Fax:  [phone]

## 2016-07-30 NOTE — Patient Instructions (Signed)
Check your blood pressure weekly x 4 weeks and call me with your results.   If you notice it's running over 140 systolic consistently, we will need to start medication Try to cut down on your red wine a little and cheese to help bp and cholesterol. We'll get you set up for cologuard.

## 2016-09-03 ENCOUNTER — Encounter: Payer: Self-pay | Admitting: Neurology

## 2016-09-03 ENCOUNTER — Ambulatory Visit (INDEPENDENT_AMBULATORY_CARE_PROVIDER_SITE_OTHER): Payer: BLUE CROSS/BLUE SHIELD | Admitting: Neurology

## 2016-09-03 ENCOUNTER — Telehealth: Payer: Self-pay | Admitting: Neurology

## 2016-09-03 ENCOUNTER — Ambulatory Visit: Payer: BLUE CROSS/BLUE SHIELD | Admitting: Neurology

## 2016-09-03 VITALS — BP 150/83 | HR 63 | Ht 68.25 in | Wt 216.0 lb

## 2016-09-03 DIAGNOSIS — G243 Spasmodic torticollis: Secondary | ICD-10-CM | POA: Diagnosis not present

## 2016-09-03 NOTE — Progress Notes (Signed)
**  Dysport 500 units x 2 vials, Lot [id number], Exp 09/13/2016, [facility] [id number], specialty pharmacy.//mck,rn**

## 2016-09-03 NOTE — Progress Notes (Signed)
PATIENT: Andrew Bentley DOB: 23-Nov-1955  HISTORICAL  Andrew Bentley is a 60 years old right-handed Caucasian male, came back for revisit, for potential EMG guided Botox injection for his cervical dystonia  He had a gradual onset neck turning to the left side, abnormal neck posture since 2005, progressively worse since 2008, he was enrolled in Dysport research trial sponsored by [employer] since 2013, he totally received 4 injection, last injection was November 2013, complete the study successfully in April 2014, which is his last office visit.   During the trial, he receive EMG guided dysport injection every 3 months, 500 units, he tolerated the injection very well, showed significant improvement of his neck posturing  He continue to works out regularly, he denies gait difficulty, he complains of left occipital area radiating pain to his left parietal area, he denies radiating pain from his neck to his arms, and hands  UPDATE Jan 20th 2016: He signed a consent form, also documented under communication dated October 03 2014, potential side effect explained, he had a bulky right sternocleidomastoid, left posterior cervical paraspinal muscles.  UPDATE February 13 2015: He responded very well to previous injection in October 03 2014,he received 500 units of Dysport, he can move his neck better, no significant side effect, no neck extension weakness, no swallowing difficulties  He has almost constant head turning to the left side even at the peak benefit of the Dysport injection  UPDATE May 11th 2017: Last injection was in Sept 2016, he received Dysport 500 units times 2, he had transient dysphagia, and dysarthria lasting for couple weeks, otherwise injection was very helpful, he continued to exercise regularly, receiving chiropractic adjustment regularly  Update April 29 2016:  He responded very well to previous injection Dysport 500 unitsx2 in May 2017, did reported side effect of mild transient  swallowing difficulty, weak voice, difficulty extending his neck,  Update September 03 2016, He responded very well to previous injectionsignificant side effect notice,  REVIEW OF SYSTEMS: Full 14 system review of systems performed and notable only for as above  ALLERGIES: No Known Allergies  HOME MEDICATIONS: Current Outpatient Prescriptions on File Prior to Visit  Medication Sig Dispense Refill  . IncobotulinumtoxinA (XEOMIN IM) Inject 1,000 Units into the muscle.     No current facility-administered medications on file prior to visit.     PAST MEDICAL HISTORY: Past Medical History:  Diagnosis Date  . Cervical dystonia   . Transaminitis     PAST SURGICAL HISTORY: Past Surgical History:  Procedure Laterality Date  . TONSILLECTOMY  1963    FAMILY HISTORY: Family History  Problem Relation Age of Onset  . Bipolar disorder Mother   . Dementia Mother   . Parkinson's disease Mother   . Memory loss Father     SOCIAL HISTORY:  Social History   Social History  . Marital status: Married    Spouse name: N/A  . Number of children: 0  . Years of education: college   Occupational History  .      Self Emp.   Social History Main Topics  . Smoking status: Never Smoker  . Smokeless tobacco: Never Used  . Alcohol use 1.2 oz/week    2 Glasses of wine per week     Comment: Red wine with dinner  . Drug use: No  . Sexual activity: Not on file   Other Topics Concern  . 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   Right handed.   Caffeine two cups of coffee daily.   2 dogs, 1 cat   Exercises      PHYSICAL EXAM   Vitals:   09/03/16 0958  BP: (!) 150/83  Pulse: 63  Weight: 216 lb (98 kg)  Height: 5' 8.25" (1.734 m)    Not recorded      Body mass index is 32.6 kg/m.   Generalized: In no acute  distress  Neck: Supple, no carotid bruits   Cardiac: Regular rate rhythm  Pulmonary: Clear to auscultation bilaterally  Musculoskeletal: No deformity  Neurological examination  Mentation: Alert oriented to time, place, history taking, and causual conversation, he has moderate left neck turning, mild anterocollis, mild right tilt, mild left shoulder elevation, bulky right sternocleidomastoid, left posterior cervical paraspinal muscles, difficulty turning to the right side  Cranial nerve II-XII: Pupils were equal round reactive to light. Extraocular movements were full.  Visual field were full on confrontational test. Bilateral fundi were sharp.  Facial sensation and strength were normal. Hearing was intact to finger rubbing bilaterally. Uvula tongue midline.  Head turning and shoulder shrug and were normal and symmetric.Tongue protrusion into cheek strength was normal.  Motor: Normal tone, bulk and strength.  Sensory: Intact to fine touch, pinprick, preserved vibratory sensation, and proprioception at toes.  Coordination: Normal finger to nose, heel-to-shin bilaterally there was no truncal ataxia  Gait: Rising up from seated position without assistance, normal stance, without trunk ataxia, moderate stride, good arm swing, smooth turning, able to perform tiptoe, and heel walking without difficulty.   Romberg signs: Negative  Deep tendon reflexes: Brachioradialis 2/2, biceps 2/2, triceps 2/2, patellar 2/2, Achilles 2/2, plantar responses were flexor bilaterally.   DIAGNOSTIC DATA (LABS, IMAGING, TESTING) - I reviewed patient records, labs, notes, testing and imaging myself where available.  ASSESSMENT AND PLAN  Andrew Bentley is a 60 y.o. male with long-standing history of cervical dystonia, responded very well to previous EMG guided dysport injection.  He has left turning, left lateral call us, mild left shoulder elevation, no no head tremor,  Under EMG guidance, 500 x2 =1,000 units  of Dysport were injected (500 units/2.5 cc for total of 5.0 cc)  Right sternocleidomastoid 0.5cc  Left longissimus capitis 0. 5 cc x2=1.0 cc Left semispinalis 0.5 cc Right inferior oblique capitus 0.5 cc Left inferior oblique capitus 0.5 cc  Left splenius capitis 0.5 ccx2=1.0 Left  splenius cervix 0.5 cc  Left scalenus posterior 0.5 cc   Return to clinic in 3 months  Levert FeinsteinYijun Clydell Sposito, M.D. Ph.D.  [facility] [address], [address] [place], [place] [address] [phone]

## 2016-09-03 NOTE — Telephone Encounter (Signed)
EMG guided Dysport injection 500 units x2 with Danielle Needs scheduled for 3 months.

## 2016-09-24 ENCOUNTER — Encounter: Payer: Self-pay | Admitting: Internal Medicine

## 2016-10-06 NOTE — Telephone Encounter (Signed)
Called patient to schedule next injection he did not answer so I left a VM asking him to call back.

## 2016-11-20 ENCOUNTER — Encounter: Payer: Self-pay | Admitting: Internal Medicine

## 2016-12-29 ENCOUNTER — Telehealth: Payer: Self-pay | Admitting: Neurology

## 2016-12-29 NOTE — Telephone Encounter (Signed)
I called the patient to cancel his apt for tomorrow, he did not answer and his VM was full.

## 2016-12-30 ENCOUNTER — Ambulatory Visit: Payer: BLUE CROSS/BLUE SHIELD | Admitting: Neurology

## 2016-12-30 NOTE — Telephone Encounter (Signed)
I spoke with the patient to confirm cancellation of his apt today.

## 2016-12-31 NOTE — Telephone Encounter (Signed)
Please advise 

## 2017-01-04 NOTE — Telephone Encounter (Signed)
I called the patient and scheduled him for his injections.

## 2017-01-07 ENCOUNTER — Other Ambulatory Visit: Payer: Self-pay

## 2017-01-07 DIAGNOSIS — E7800 Pure hypercholesterolemia, unspecified: Secondary | ICD-10-CM

## 2017-01-07 DIAGNOSIS — R740 Nonspecific elevation of levels of transaminase and lactic acid dehydrogenase [LDH]: Secondary | ICD-10-CM

## 2017-01-07 DIAGNOSIS — R7401 Elevation of levels of liver transaminase levels: Secondary | ICD-10-CM

## 2017-01-07 DIAGNOSIS — E663 Overweight: Secondary | ICD-10-CM

## 2017-01-08 DIAGNOSIS — G243 Spasmodic torticollis: Secondary | ICD-10-CM | POA: Diagnosis not present

## 2017-01-13 ENCOUNTER — Encounter: Payer: Self-pay | Admitting: Neurology

## 2017-01-13 ENCOUNTER — Ambulatory Visit (INDEPENDENT_AMBULATORY_CARE_PROVIDER_SITE_OTHER): Payer: BLUE CROSS/BLUE SHIELD | Admitting: Neurology

## 2017-01-13 VITALS — BP 140/84 | HR 81 | Ht 68.25 in | Wt 209.0 lb

## 2017-01-13 DIAGNOSIS — G243 Spasmodic torticollis: Secondary | ICD-10-CM

## 2017-01-13 NOTE — Progress Notes (Signed)
PATIENT: Andrew Bentley DOB: 11-Nov-1955  HISTORICAL  Andrew Bentley is a 61 years old right-handed Caucasian male, came back for revisit, for potential EMG guided Botox injection for his cervical dystonia  He had a gradual onset neck turning to the left side, abnormal neck posture since 2005, progressively worse since 2008, he was enrolled in Dysport research trial sponsored by [employer] since 2013, he totally received 4 injection, last injection was November 2013, complete the study successfully in April 2014, which is his last office visit.   During the trial, he receive EMG guided dysport injection every 3 months, 500 units, he tolerated the injection very well, showed significant improvement of his neck posturing  He continue to works out regularly, he denies gait difficulty, he complains of left occipital area radiating pain to his left parietal area, he denies radiating pain from his neck to his arms, and hands  UPDATE Jan 20th 2016: He signed a consent form, also documented under communication dated October 03 2014, potential side effect explained, he had a bulky right sternocleidomastoid, left posterior cervical paraspinal muscles.  UPDATE February 13 2015: He responded very well to previous injection in October 03 2014,he received 500 units of Dysport, he can move his neck better, no significant side effect, no neck extension weakness, no swallowing difficulties  He has almost constant head turning to the left side even at the peak benefit of the Dysport injection  UPDATE May 11th 2017: Last injection was in Sept 2016, he received Dysport 500 units times 2, he had transient dysphagia, and dysarthria lasting for couple weeks, otherwise injection was very helpful, he continued to exercise regularly, receiving chiropractic adjustment regularly  Update April 29 2016:  He responded very well to previous injection Dysport 500 unitsx2 in May 2017, did reported side effect of mild transient  swallowing difficulty, weak voice, difficulty extending his neck,  Update September 03 2016, He responded very well to previous injectionsignificant side effect notice,  UPDATE Jan 13 2017: He had returning of frequent neck no no shaking movement, forceful pushing towards the left side, also lean towards his left shoulder, he has right posterior neck pain  REVIEW OF SYSTEMS: Full 14 system review of systems performed and notable only for as above  ALLERGIES: No Known Allergies  HOME MEDICATIONS: No current outpatient prescriptions on file prior to visit.   No current facility-administered medications on file prior to visit.     PAST MEDICAL HISTORY: Past Medical History:  Diagnosis Date  . Cervical dystonia   . Transaminitis     PAST SURGICAL HISTORY: Past Surgical History:  Procedure Laterality Date  . TONSILLECTOMY  1963    FAMILY HISTORY: Family History  Problem Relation Age of Onset  . Bipolar disorder Mother   . Dementia Mother   . Parkinson's disease Mother   . Memory loss Father     SOCIAL HISTORY:  Social History   Social History  . Marital status: Married    Spouse name: N/A  . Number of children: 0  . Years of education: college   Occupational History  .      Self [employer].   Social History Main Topics  . Smoking status: Never Smoker  . Smokeless tobacco: Never Used  . Alcohol use 1.2 oz/week    2 Glasses of wine per week     Comment: Red wine with dinner  . Drug use: No  . Sexual activity: Not on file   Other Topics Concern  .  Not on file   Social History Narrative   Patient lives at home (one stories) with his wife [name], married since 1985    Patient works full time Market researcher.  [employer]   Education college education.   Right handed.   Caffeine two cups of coffee daily.   2 dogs, 1 cat   Exercises      PHYSICAL EXAM   Vitals:   01/13/17 1458  Height: 5' 8.25" (1.734 m)    Not recorded      There is no height or weight on  file to calculate BMI.   Generalized: In no acute distress  Neck: Supple, no carotid bruits   Cardiac: Regular rate rhythm  Pulmonary: Clear to auscultation bilaterally  Musculoskeletal: No deformity  Neurological examination  Mentation: Alert oriented to time, place, history taking, and causual conversation, he has moderate left neck turning, mild anterocollis, mild right tilt, mild left shoulder elevation, bulky right sternocleidomastoid, left posterior cervical paraspinal muscles, difficulty turning to the right side  Cranial nerve II-XII: Pupils were equal round reactive to light. Extraocular movements were full.  Visual field were full on confrontational test. Bilateral fundi were sharp.  Facial sensation and strength were normal. Hearing was intact to finger rubbing bilaterally. Uvula tongue midline.  Head turning and shoulder shrug and were normal and symmetric.Tongue protrusion into cheek strength was normal.  Motor: Normal tone, bulk and strength.  Sensory: Intact to fine touch, pinprick, preserved vibratory sensation, and proprioception at toes.  Coordination: Normal finger to nose, heel-to-shin bilaterally there was no truncal ataxia  Gait: Rising up from seated position without assistance, normal stance, without trunk ataxia, moderate stride, good arm swing, smooth turning, able to perform tiptoe, and heel walking without difficulty.   Romberg signs: Negative  Deep tendon reflexes: Brachioradialis 2/2, biceps 2/2, triceps 2/2, patellar 2/2, Achilles 2/2, plantar responses were flexor bilaterally.   DIAGNOSTIC DATA (LABS, IMAGING, TESTING) - I reviewed patient records, labs, notes, testing and imaging myself where available.  ASSESSMENT AND PLAN  [name] is a 61 y.o. male with long-standing history of cervical dystonia, responded very well to previous EMG guided dysport injection.  He has significant left turning, mild retrocollis, moderate left laterocollis,  mild left shoulder elevation,    Under EMG guidance, 500 x2 =1,000 units of Dysport were injected (500 units/2.5 cc for total of 5.0 cc)  Right sternocleidomastoid 0.5cc  Left longissimus capitis 0. 5 cc   Left semispinalis 0.5 cc Right inferior oblique capitus 0.5 cc Left inferior oblique capitus 0.5 cc  Left splenius capitis 0.5 cc  Left  splenius cervix 0.5 cc x2= 1.0 CC Left levator scapular 0.25 cc  Right levator scapular 0.75 cc   Return to clinic in 3 months  Levert Feinstein, M.D. Ph.D.  [facility] [address] [place], [place] [address] [phone]

## 2017-01-13 NOTE — Progress Notes (Signed)
**  Dysport 500 units x 2 vials, NDC [address]-4, Lot [id number], Exp 09/13/2017, specialty pharmacy.//mck,rn**

## 2017-01-27 ENCOUNTER — Other Ambulatory Visit: Payer: BLUE CROSS/BLUE SHIELD

## 2017-01-27 DIAGNOSIS — E7800 Pure hypercholesterolemia, unspecified: Secondary | ICD-10-CM

## 2017-01-27 DIAGNOSIS — R740 Nonspecific elevation of levels of transaminase and lactic acid dehydrogenase [LDH]: Secondary | ICD-10-CM

## 2017-01-27 DIAGNOSIS — E663 Overweight: Secondary | ICD-10-CM

## 2017-01-27 DIAGNOSIS — R7401 Elevation of levels of liver transaminase levels: Secondary | ICD-10-CM

## 2017-01-27 LAB — HEPATIC FUNCTION PANEL
ALT: 17 U/L (ref 9–46)
AST: 20 U/L (ref 10–35)
Albumin: 4.2 g/dL (ref 3.6–5.1)
Alkaline Phosphatase: 56 U/L (ref 40–115)
Bilirubin, Direct: 0.1 mg/dL (ref <=0.2)
Indirect Bilirubin: 0.4 mg/dL (ref 0.2–1.2)
Total Bilirubin: 0.5 mg/dL (ref 0.2–1.2)
Total Protein: 6.7 g/dL (ref 6.1–8.1)

## 2017-01-27 LAB — LIPID PANEL
Cholesterol: 163 mg/dL (ref <200)
HDL: 55 mg/dL (ref >40)
LDL Cholesterol: 96 mg/dL (ref <100)
Total CHOL/HDL Ratio: 3 Ratio (ref <5.0)
Triglycerides: 60 mg/dL (ref <150)
VLDL: 12 mg/dL (ref <30)

## 2017-01-29 ENCOUNTER — Encounter: Payer: Self-pay | Admitting: Internal Medicine

## 2017-01-29 ENCOUNTER — Ambulatory Visit (INDEPENDENT_AMBULATORY_CARE_PROVIDER_SITE_OTHER): Payer: BLUE CROSS/BLUE SHIELD | Admitting: Internal Medicine

## 2017-01-29 VITALS — BP 120/70 | HR 69 | Temp 98.1°F | Wt 206.0 lb

## 2017-01-29 DIAGNOSIS — E7800 Pure hypercholesterolemia, unspecified: Secondary | ICD-10-CM | POA: Diagnosis not present

## 2017-01-29 DIAGNOSIS — G243 Spasmodic torticollis: Secondary | ICD-10-CM | POA: Diagnosis not present

## 2017-01-29 DIAGNOSIS — R740 Nonspecific elevation of levels of transaminase and lactic acid dehydrogenase [LDH]: Secondary | ICD-10-CM | POA: Diagnosis not present

## 2017-01-29 DIAGNOSIS — E663 Overweight: Secondary | ICD-10-CM | POA: Diagnosis not present

## 2017-01-29 DIAGNOSIS — R7401 Elevation of levels of liver transaminase levels: Secondary | ICD-10-CM

## 2017-01-29 NOTE — Progress Notes (Signed)
[facility] Provider:  Dicky Boer L. Renato Gailseed, D.O., C.M.D.  Code Status: full code Goals of Care:  Advanced Directives 01/29/2017  Does Patient Have a Medical Advance Directive? No  Does patient want to make changes to medical advance directive? -  Would patient like information on creating a medical advance directive? No - Patient declined   Chief Complaint  Patient presents with  . Medical Management of Chronic Issues    6mth follow-up for BP and Cholesterol    HPI: Patient is a 61 y.o. male seen today for medical management of chronic diseases.    BP at goal here today.  Checks at home with wrist cuff. He notices when he's at work that it goes up in the afternoon and he has a lot of stress with his parents.    Getting quarterly dysport injections now.  Had been delayed one month and it got to where he had a pinched nerve.  A lot better now. Gets up and stretches mid-day.  He and his wife have tried to change their diet a little.  His drinking habits have not changed--still coffee, red wine and water.  He does have improvement in lipids from 116 to 96.    Down 3 lbs.  Past Medical History:  Diagnosis Date  . Cervical dystonia   . Transaminitis     Past Surgical History:  Procedure Laterality Date  . TONSILLECTOMY  1963    No Known Allergies  Allergies as of 01/29/2017   No Known Allergies     Medication List       Accurate as of 01/29/17  8:36 AM. Always use your most recent med list.          DYSPORT IM Inject 1,000 Units into the muscle every 3 (three) months.       Review of Systems:  Review of Systems  Constitutional: Negative for chills, fever and malaise/fatigue.  HENT: Negative for hearing loss.   Eyes: Negative for blurred vision.  Respiratory: Negative for shortness of breath.   Cardiovascular: Negative for chest pain and palpitations.  Gastrointestinal: Negative for abdominal pain, blood in stool, constipation and melena.    Genitourinary: Negative for dysuria and urgency.  Musculoskeletal: Positive for myalgias and neck pain.  Skin: Negative for itching and rash.  Neurological: Negative for dizziness and loss of consciousness.  Endo/Heme/Allergies: Does not bruise/bleed easily.  Psychiatric/Behavioral: Negative for depression. The patient is nervous/anxious.     Health Maintenance  Topic Date Due  . Hepatitis C Screening  Apr 06, 1956  . HIV Screening  12/28/1972  . COLONOSCOPY  12/29/2007  . TETANUS/TDAP  07/30/2026 (Originally 12/28/1976)  . INFLUENZA VACCINE  04/14/2017    Physical Exam: Vitals:   01/29/17 0820  BP: 120/70  Pulse: 69  Temp: 98.1 F (36.7 C)  TempSrc: Oral  SpO2: 97%  Weight: 206 lb (93.4 kg)   Body mass index is 31.09 kg/m. Physical Exam  Constitutional: He is oriented to person, place, and time. He appears well-developed and well-nourished. No distress.  Cardiovascular: Normal rate, regular rhythm, normal heart sounds and intact distal pulses.   Pulmonary/Chest: Effort normal and breath sounds normal. No respiratory distress.  Abdominal: Bowel sounds are normal.  Musculoskeletal: Normal range of motion.  Decreased ROM of neck  Neurological: He is alert and oriented to person, place, and time.  Skin: Skin is warm and dry.    Labs reviewed: Basic Metabolic Panel:  Recent Labs  78/29/5611/15/17 0817  NA 138  K 4.2  CL 101  CO2 28  GLUCOSE 98  BUN 15  CREATININE 1.08  CALCIUM 9.8   Liver Function Tests:  Recent Labs  07/29/16 0817 01/27/17 0819  AST 27 20  ALT 21 17  ALKPHOS 47 56  BILITOT 1.1 0.5  PROT 6.9 6.7  ALBUMIN 4.4 4.2   No results for input(s): LIPASE, AMYLASE in the last 8760 hours. No results for input(s): AMMONIA in the last 8760 hours. CBC:  Recent Labs  07/29/16 0817  WBC 4.7  NEUTROABS 3,055  HGB 14.9  HCT 44.4  MCV 91.0  PLT 314   Lipid Panel:  Recent Labs  07/29/16 0817 01/27/17 0819  CHOL 192 163  HDL 57 55  LDLCALC  116* 96  TRIG 97 60  CHOLHDL 3.4 3.0   Lab Results  Component Value Date   HGBA1C 5.0 07/29/2016    Assessment/Plan 1. Pure hypercholesterolemia -improved with better diet - CBC with Differential/Platelet; Future - COMPLETE METABOLIC PANEL WITH GFR; Future - Lipid panel; Future  2. Cervical dystonia -improves each time with botox injections  3. Overweight (BMI 25.0-29.9) - weight is trending down with diet and exercise - COMPLETE METABOLIC PANEL WITH GFR; Future  4. Transaminitis - better despite ongoing wine drinking - COMPLETE METABOLIC PANEL WITH GFR; Future  Labs/tests ordered:  Orders Placed This Encounter  Procedures  . CBC with Differential/Platelet    Standing Status:   Future    Standing Expiration Date:   01/29/2018  . COMPLETE METABOLIC PANEL WITH GFR    Standing Status:   Future    Standing Expiration Date:   01/29/2018  . Lipid panel    Standing Status:   Future    Standing Expiration Date:   01/29/2018    Next appt:  6 mos for annual  Zaveon Gillen L. Lateya Dauria, D.O. Geriatrics [facility] [address], [place] [address] Cell Phone (Mon-Fri 8am-5pm):  [phone] On Call:  [phone] & follow prompts after 5pm & weekends Office Phone:  [phone] Office Fax:  [phone]

## 2017-04-16 ENCOUNTER — Other Ambulatory Visit: Payer: Self-pay | Admitting: Neurology

## 2017-04-21 ENCOUNTER — Ambulatory Visit (INDEPENDENT_AMBULATORY_CARE_PROVIDER_SITE_OTHER): Payer: BLUE CROSS/BLUE SHIELD | Admitting: Neurology

## 2017-04-21 DIAGNOSIS — G243 Spasmodic torticollis: Secondary | ICD-10-CM

## 2017-04-22 ENCOUNTER — Telehealth: Payer: Self-pay | Admitting: Neurology

## 2017-04-22 NOTE — Telephone Encounter (Signed)
Pt needs another inj °

## 2017-04-22 NOTE — Telephone Encounter (Signed)
I called the patient and scheduled his injections.

## 2017-04-22 NOTE — Progress Notes (Signed)
PATIENT: Andrew Bentley DOB: 18-Mar-1956  HISTORICAL  Andrew Bentley is a 61 years old right-handed Caucasian male, came back for revisit, for potential EMG guided Botox injection for his cervical dystonia  He had a gradual onset neck turning to the left side, abnormal neck posture since 2005, progressively worse since 2008, he was enrolled in Dysport research trial sponsored by [employer] since 2013, he totally received 4 injection, last injection was November 2013, complete the study successfully in April 2014, which is his last office visit.   During the trial, he receive EMG guided dysport injection every 3 months, 500 units, he tolerated the injection very well, showed significant improvement of his neck posturing  He continue to works out regularly, he denies gait difficulty, he complains of left occipital area radiating pain to his left parietal area, he denies radiating pain from his neck to his arms, and hands  UPDATE Jan 20th 2016: He signed a consent form, also documented under communication dated October 03 2014, potential side effect explained, he had a bulky right sternocleidomastoid, left posterior cervical paraspinal muscles.  UPDATE February 13 2015: He responded very well to previous injection in October 03 2014,he received 500 units of Dysport, he can move his neck better, no significant side effect, no neck extension weakness, no swallowing difficulties  He has almost constant head turning to the left side even at the peak benefit of the Dysport injection  UPDATE May 11th 2017: Last injection was in Sept 2016, he received Dysport 500 units times 2, he had transient dysphagia, and dysarthria lasting for couple weeks, otherwise injection was very helpful, he continued to exercise regularly, receiving chiropractic adjustment regularly  Update April 29 2016:  He responded very well to previous injection Dysport 500 unitsx2 in May 2017, did reported side effect of mild transient  swallowing difficulty, weak voice, difficulty extending his neck,  Update September 03 2016, He responded very well to previous injectionsignificant side effect notice,  UPDATE Jan 13 2017: He had returning of frequent neck no no shaking movement, forceful pushing towards the left side, also lean towards his left shoulder, he has right posterior neck pain  Update April 21 2017: Responded well to previous injection, he has significant posterior neck muscle atrophy, did complains of mild hoarse voice after injection  REVIEW OF SYSTEMS: Full 14 system review of systems performed and notable only for as above  ALLERGIES: No Known Allergies  HOME MEDICATIONS: Current Outpatient Prescriptions on File Prior to Visit  Medication Sig Dispense Refill  . AbobotulinumtoxinA (DYSPORT IM) Inject 1,000 Units into the muscle every 3 (three) months.    . DYSPORT 500 units SOLR injection TO BE ADMINISTERED TO NECK MUSCLES EVERY 3 MONTHS 2 each 3   No current facility-administered medications on file prior to visit.     PAST MEDICAL HISTORY: Past Medical History:  Diagnosis Date  . Cervical dystonia   . Transaminitis     PAST SURGICAL HISTORY: Past Surgical History:  Procedure Laterality Date  . TONSILLECTOMY  1963    FAMILY HISTORY: Family History  Problem Relation Age of Onset  . Bipolar disorder Mother   . Dementia Mother   . Parkinson's disease Mother   . Memory loss Father     SOCIAL HISTORY:  Social History   Social History  . Marital status: Married    Spouse name: N/A  . Number of children: 0  . Years of education: college   Occupational History  .  Self Emp.   Social History Main Topics  . Smoking status: Never Smoker  . Smokeless tobacco: Never Used  . Alcohol use 1.2 oz/week    2 Glasses of wine per week     Comment: Red wine with dinner  . Drug use: No  . Sexual activity: Not on file   Other Topics Concern  . Not on file   Social History Narrative     Patient lives at home (one stories) with his wife Andrew Bentley, married since 1985    Patient works full time Market researcher.  [employer]   Education college education.   Right handed.   Caffeine two cups of coffee daily.   2 dogs, 1 cat   Exercises      PHYSICAL EXAM   There were no vitals filed for this visit.  Not recorded      There is no height or weight on file to calculate BMI.   Generalized: In no acute distress  Neck: Supple, no carotid bruits   Cardiac: Regular rate rhythm  Pulmonary: Clear to auscultation bilaterally  Musculoskeletal: No deformity  Neurological examination  Mentation: Alert oriented to time, place, history taking, and causual conversation, he has moderate left neck turning, mild anterocollis, mild right tilt, mild left shoulder elevation, bulky right sternocleidomastoid, left posterior cervical paraspinal muscles, difficulty turning to the right side  Cranial nerve II-XII: Pupils were equal round reactive to light. Extraocular movements were full.  Visual field were full on confrontational test. Bilateral fundi were sharp.  Facial sensation and strength were normal. Hearing was intact to finger rubbing bilaterally. Uvula tongue midline.  Head turning and shoulder shrug and were normal and symmetric.Tongue protrusion into cheek strength was normal.  Motor: Normal tone, bulk and strength.  Sensory: Intact to fine touch, pinprick, preserved vibratory sensation, and proprioception at toes.  Coordination: Normal finger to nose, heel-to-shin bilaterally there was no truncal ataxia  Gait: Rising up from seated position without assistance, normal stance, without trunk ataxia, moderate stride, good arm swing, smooth turning, able to perform tiptoe, and heel walking without difficulty.   Romberg signs: Negative  Deep tendon reflexes: Brachioradialis 2/2, biceps 2/2, triceps 2/2, patellar 2/2, Achilles 2/2, plantar responses were flexor  bilaterally.   DIAGNOSTIC DATA (LABS, IMAGING, TESTING) - I reviewed patient records, labs, notes, testing and imaging myself where available.  ASSESSMENT AND PLAN  Andrew Bentley is a 61 y.o. male with long-standing history of cervical dystonia, responded very well to previous EMG guided dysport injection.  He has significant left turning,moderate left laterocollis, moderate left anterocollis, mild left shoulder elevation,    Under EMG guidance, 500 x2 =1,000 units of Dysport were injected (500 /2.5 cc for total of 5.0 cc)  Right sternocleidomastoid 0.5cc  Left longissimus capitis 0. 5 cc   Left semispinalis 0.5 cc  Left splenius capitis 0.5 cc  Left scalenus posterior 0.5 Left  splenius cervix 0.5 cc x2= 1.0 CC Left levator scapular 0.25 cc  Right splenius cervix  0.5 cc Right levator scapular 0.75 cc   Return to clinic in 3 months  Levert Feinstein, M.D. Ph.D.  [facility] [address], [address] [place], [place] [address] [phone]

## 2017-04-28 DIAGNOSIS — G243 Spasmodic torticollis: Secondary | ICD-10-CM | POA: Diagnosis not present

## 2017-07-16 ENCOUNTER — Encounter: Payer: Self-pay | Admitting: Internal Medicine

## 2017-07-20 DIAGNOSIS — G243 Spasmodic torticollis: Secondary | ICD-10-CM | POA: Diagnosis not present

## 2017-07-21 ENCOUNTER — Encounter: Payer: Self-pay | Admitting: Neurology

## 2017-07-21 ENCOUNTER — Telehealth: Payer: Self-pay | Admitting: Neurology

## 2017-07-21 ENCOUNTER — Ambulatory Visit (INDEPENDENT_AMBULATORY_CARE_PROVIDER_SITE_OTHER): Payer: BLUE CROSS/BLUE SHIELD | Admitting: Neurology

## 2017-07-21 VITALS — BP 121/76 | HR 69 | Ht 68.25 in | Wt 215.5 lb

## 2017-07-21 DIAGNOSIS — G243 Spasmodic torticollis: Secondary | ICD-10-CM

## 2017-07-21 NOTE — Progress Notes (Signed)
Dysport 500units x 2 vials. Lot: [id number], Exp: 02/11/2018 and Lot: [id number], Exp: 03/13/2018.

## 2017-07-21 NOTE — Telephone Encounter (Signed)
PT needs inj again in 3 mos

## 2017-07-21 NOTE — Progress Notes (Signed)
PATIENT: Andrew Bentley DOB: Dec 18, 1955  HISTORICAL  Andrew Bentley is a 61 years old right-handed Caucasian male, came back for revisit, for potential EMG guided Botox injection for his cervical dystonia  He had a gradual onset neck turning to the left side, abnormal neck posture since 2005, progressively worse since 2008, he was enrolled in Dysport research trial sponsored by [employer] since 2013, he totally received 4 injection, last injection was November 2013, complete the study successfully in April 2014, which is his last office visit.   During the trial, he receive EMG guided dysport injection every 3 months, 500 units, he tolerated the injection very well, showed significant improvement of his neck posturing  He continue to works out regularly, he denies gait difficulty, he complains of left occipital area radiating pain to his left parietal area, he denies radiating pain from his neck to his arms, and hands  UPDATE Jan 20th 2016: He signed a consent form, also documented under communication dated October 03 2014, potential side effect explained, he had a bulky right sternocleidomastoid, left posterior cervical paraspinal muscles.  UPDATE February 13 2015: He responded very well to previous injection in October 03 2014,he received 500 units of Dysport, he can move his neck better, no significant side effect, no neck extension weakness, no swallowing difficulties  He has almost constant head turning to the left side even at the peak benefit of the Dysport injection  UPDATE May 11th 2017: Last injection was in Sept 2016, he received Dysport 500 units times 2, he had transient dysphagia, and dysarthria lasting for couple weeks, otherwise injection was very helpful, he continued to exercise regularly, receiving chiropractic adjustment regularly  Update April 29 2016:  He responded very well to previous injection Dysport 500 unitsx2 in May 2017, did reported side effect of mild transient  swallowing difficulty, weak voice, difficulty extending his neck,  Update September 03 2016, He responded very well to previous injectionsignificant side effect notice,  UPDATE Jan 13 2017: He had returning of frequent neck no no shaking movement, forceful pushing towards the left side, also lean towards his left shoulder, he has right posterior neck pain  Update April 21 2017: Responded well to previous injection, he has significant posterior neck muscle atrophy, did complains of mild hoarse voice after injection  UPDATE Jul 23 2017: He respond well to previous injection, recent onset of low back pain  REVIEW OF SYSTEMS: Full 14 system review of systems performed and notable only for as above  ALLERGIES: No Known Allergies  HOME MEDICATIONS: Current Outpatient Medications on File Prior to Visit  Medication Sig Dispense Refill  . DYSPORT 500 units SOLR injection TO BE ADMINISTERED TO NECK MUSCLES EVERY 3 MONTHS 2 each 3   No current facility-administered medications on file prior to visit.     PAST MEDICAL HISTORY: Past Medical History:  Diagnosis Date  . Cervical dystonia   . Transaminitis     PAST SURGICAL HISTORY: Past Surgical History:  Procedure Laterality Date  . TONSILLECTOMY  1963    FAMILY HISTORY: Family History  Problem Relation Age of Onset  . Bipolar disorder Mother   . Dementia Mother   . Parkinson's disease Mother   . Memory loss Father     SOCIAL HISTORY:  Social History   Socioeconomic History  . Marital status: Married    Spouse name: Not on file  . Number of children: 0  . Years of education: college  . Highest education level: Not  on file  Social Needs  . Financial resource strain: Not on file  . Food insecurity - worry: Not on file  . Food insecurity - inability: Not on file  . Transportation needs - medical: Not on file  . Transportation needs - non-medical: Not on file  Occupational History    Comment: Self Emp.  Tobacco Use  .  Smoking status: Never Smoker  . Smokeless tobacco: Never Used  Substance and Sexual Activity  . Alcohol use: Yes    Alcohol/week: 1.2 oz    Types: 2 Glasses of wine per week    Comment: Red wine with dinner  . Drug use: No  . Sexual activity: Not on file  Other Topics Concern  . 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   Right handed.   Caffeine two cups of coffee daily.   2 dogs, 1 cat   Exercises      PHYSICAL EXAM   Vitals:   07/21/17 1558  Height: 5' 8.25" (1.734 m)    Not recorded      Body mass index is 31.09 kg/m.   Generalized: In no acute distress  Neck: Supple, no carotid bruits   Cardiac: Regular rate rhythm  Pulmonary: Clear to auscultation bilaterally  Musculoskeletal: No deformity  Neurological examination  Mentation: Alert oriented to time, place, history taking, and causual conversation, he has moderate left neck turning, mild anterocollis, mild right tilt, mild left shoulder elevation, bulky right sternocleidomastoid, left posterior cervical paraspinal muscles, difficulty turning to the right side  Cranial nerve II-XII: Pupils were equal round reactive to light. Extraocular movements were full.  Visual field were full on confrontational test. Bilateral fundi were sharp.  Facial sensation and strength were normal. Hearing was intact to finger rubbing bilaterally. Uvula tongue midline.  Head turning and shoulder shrug and were normal and symmetric.Tongue protrusion into cheek strength was normal.  Motor: Normal tone, bulk and strength.  Sensory: Intact to fine touch, pinprick, preserved vibratory sensation, and proprioception at toes.  Coordination: Normal finger to nose, heel-to-shin bilaterally there was no truncal ataxia  Gait: Rising up from seated position without assistance, normal  stance, without trunk ataxia, moderate stride, good arm swing, smooth turning, able to perform tiptoe, and heel walking without difficulty.   Romberg signs: Negative  Deep tendon reflexes: Brachioradialis 2/2, biceps 2/2, triceps 2/2, patellar 2/2, Achilles 2/2, plantar responses were flexor bilaterally.   DIAGNOSTIC DATA (LABS, IMAGING, TESTING) - I reviewed patient records, labs, notes, testing and imaging myself where available.  ASSESSMENT AND PLAN  Andrew Bentley is a 61 y.o. male with long-standing history of cervical dystonia, responded very well to previous EMG guided dysport injection.  He has significant left turning,moderate left laterocollis, moderate left anterocollis, mild left shoulder elevation,    Under EMG guidance, 500 x2 =1,000 units of Dysport were injected (500 /2.5 cc for total of 5.0 cc)  Right sternocleidomastoid 0.5cc  Left longissimus capitis 0. 5 cc   Left semispinalis 0.5 cc x2=1.0 cc Left splenius capitis 0.5 cc x2=1.0 cc Left scalenus posterior 0.5 cc Left iliocostalis 0.5 cc Left  splenius cervix 0.5 cc x2= 1.0 CC   Return to clinic in 3 months  Levert FeinsteinYijun Quentina Fronek, M.D. Ph.D.  [facility] [address], [address] [place], [place] [address] [phone]

## 2017-07-22 NOTE — Telephone Encounter (Signed)
I called the patient and schedule his next injection.

## 2017-08-17 ENCOUNTER — Other Ambulatory Visit: Payer: BLUE CROSS/BLUE SHIELD

## 2017-08-17 DIAGNOSIS — E663 Overweight: Secondary | ICD-10-CM | POA: Diagnosis not present

## 2017-08-17 DIAGNOSIS — E7800 Pure hypercholesterolemia, unspecified: Secondary | ICD-10-CM

## 2017-08-17 DIAGNOSIS — R740 Nonspecific elevation of levels of transaminase and lactic acid dehydrogenase [LDH]: Secondary | ICD-10-CM

## 2017-08-17 DIAGNOSIS — R7401 Elevation of levels of liver transaminase levels: Secondary | ICD-10-CM

## 2017-08-17 LAB — CBC WITH DIFFERENTIAL/PLATELET
Basophils Absolute: 42 cells/uL (ref 0–200)
Basophils Relative: 1 %
Eosinophils Absolute: 139 cells/uL (ref 15–500)
Eosinophils Relative: 3.3 %
HCT: 42.1 % (ref 38.5–50.0)
Hemoglobin: 14.3 g/dL (ref 13.2–17.1)
Lymphs Abs: 1021 cells/uL (ref 850–3900)
MCH: 30.4 pg (ref 27.0–33.0)
MCHC: 34 g/dL (ref 32.0–36.0)
MCV: 89.4 fL (ref 80.0–100.0)
MPV: 11.2 fL (ref 7.5–12.5)
Monocytes Relative: 9.1 %
Neutro Abs: 2617 cells/uL (ref 1500–7800)
Neutrophils Relative %: 62.3 %
Platelets: 283 10*3/uL (ref 140–400)
RBC: 4.71 10*6/uL (ref 4.20–5.80)
RDW: 12.5 % (ref 11.0–15.0)
Total Lymphocyte: 24.3 %
WBC mixed population: 382 cells/uL (ref 200–950)
WBC: 4.2 10*3/uL (ref 3.8–10.8)

## 2017-08-17 LAB — COMPLETE METABOLIC PANEL WITH GFR
AG Ratio: 1.7 (calc) (ref 1.0–2.5)
ALT: 24 U/L (ref 9–46)
AST: 28 U/L (ref 10–35)
Albumin: 4.2 g/dL (ref 3.6–5.1)
Alkaline phosphatase (APISO): 52 U/L (ref 40–115)
BUN: 17 mg/dL (ref 7–25)
CO2: 29 mmol/L (ref 20–32)
Calcium: 9.2 mg/dL (ref 8.6–10.3)
Chloride: 104 mmol/L (ref 98–110)
Creat: 1.19 mg/dL (ref 0.70–1.33)
GFR, Est African American: 77 mL/min/{1.73_m2} (ref >=60)
GFR, Est Non African American: 66 mL/min/{1.73_m2} (ref >=60)
Globulin: 2.5 g/dL (calc) (ref 1.9–3.7)
Glucose, Bld: 99 mg/dL (ref 65–99)
Potassium: 4.6 mmol/L (ref 3.5–5.3)
Sodium: 138 mmol/L (ref 135–146)
Total Bilirubin: 0.9 mg/dL (ref 0.2–1.2)
Total Protein: 6.7 g/dL (ref 6.1–8.1)

## 2017-08-17 LAB — LIPID PANEL
Cholesterol: 174 mg/dL (ref <200)
HDL: 64 mg/dL (ref >40)
LDL Cholesterol (Calc): 96 mg/dL (calc)
Non-HDL Cholesterol (Calc): 110 mg/dL (calc) (ref <130)
Total CHOL/HDL Ratio: 2.7 (calc) (ref <5.0)
Triglycerides: 58 mg/dL (ref <150)

## 2017-08-19 ENCOUNTER — Ambulatory Visit (INDEPENDENT_AMBULATORY_CARE_PROVIDER_SITE_OTHER): Payer: BLUE CROSS/BLUE SHIELD | Admitting: Internal Medicine

## 2017-08-19 ENCOUNTER — Encounter: Payer: Self-pay | Admitting: Internal Medicine

## 2017-08-19 VITALS — BP 128/80 | HR 58 | Temp 98.1°F | Ht 68.0 in | Wt 213.0 lb

## 2017-08-19 DIAGNOSIS — G243 Spasmodic torticollis: Secondary | ICD-10-CM | POA: Diagnosis not present

## 2017-08-19 DIAGNOSIS — E663 Overweight: Secondary | ICD-10-CM | POA: Diagnosis not present

## 2017-08-19 DIAGNOSIS — Z0000 Encounter for general adult medical examination without abnormal findings: Secondary | ICD-10-CM | POA: Diagnosis not present

## 2017-08-19 DIAGNOSIS — K219 Gastro-esophageal reflux disease without esophagitis: Secondary | ICD-10-CM | POA: Diagnosis not present

## 2017-08-19 DIAGNOSIS — E7800 Pure hypercholesterolemia, unspecified: Secondary | ICD-10-CM

## 2017-08-19 DIAGNOSIS — R7401 Elevation of levels of liver transaminase levels: Secondary | ICD-10-CM

## 2017-08-19 DIAGNOSIS — R740 Nonspecific elevation of levels of transaminase and lactic acid dehydrogenase [LDH]: Secondary | ICD-10-CM | POA: Diagnosis not present

## 2017-08-19 NOTE — Patient Instructions (Signed)
You may try zantac daily as needed for acid reflux symptoms.    Heartburn Heartburn is a type of pain or discomfort that can happen in the throat or chest. It is often described as a burning pain. It may also cause a bad taste in the mouth. Heartburn may feel worse when you lie down or bend over, and it is often worse at night. Heartburn may be caused by stomach contents that move back up into the esophagus (reflux). Follow these instructions at home: Take these actions to decrease your discomfort and to help avoid complications. Diet  Follow a diet as recommended by your health care provider. This may involve avoiding foods and drinks such as: ? Coffee and tea (with or without caffeine). ? Drinks that contain alcohol. ? Energy drinks and sports drinks. ? Carbonated drinks or sodas. ? Chocolate and cocoa. ? Peppermint and mint flavorings. ? Garlic and onions. ? Horseradish. ? Spicy and acidic foods, including peppers, chili powder, curry powder, vinegar, hot sauces, and barbecue sauce. ? Citrus fruit juices and citrus fruits, such as oranges, lemons, and limes. ? Tomato-based foods, such as red sauce, chili, salsa, and pizza with red sauce. ? Fried and fatty foods, such as donuts, french fries, potato chips, and high-fat dressings. ? High-fat meats, such as hot dogs and fatty cuts of red and white meats, such as rib eye steak, sausage, ham, and bacon. ? High-fat dairy items, such as whole milk, butter, and cream cheese.  Eat small, frequent meals instead of large meals.  Avoid drinking large amounts of liquid with your meals.  Avoid eating meals during the 2-3 hours before bedtime.  Avoid lying down right after you eat.  Do not exercise right after you eat. General instructions  Pay attention to any changes in your symptoms.  Take over-the-counter and prescription medicines only as told by your health care provider. Do not take aspirin, ibuprofen, or other NSAIDs unless your  health care provider told you to do so.  Do not use any tobacco products, including cigarettes, chewing tobacco, and e-cigarettes. If you need help quitting, ask your health care provider.  Wear loose-fitting clothing. Do not wear anything tight around your waist that causes pressure on your abdomen.  Raise (elevate) the head of your bed about 6 inches (15 cm).  Try to reduce your stress, such as with yoga or meditation. If you need help reducing stress, ask your health care provider.  If you are overweight, reduce your weight to an amount that is healthy for you. Ask your health care provider for guidance about a safe weight loss goal.  Keep all follow-up visits as told by your health care provider. This is important. Contact a health care provider if:  You have new symptoms.  You have unexplained weight loss.  You have difficulty swallowing, or it hurts to swallow.  You have wheezing or a persistent cough.  Your symptoms do not improve with treatment.  You have frequent heartburn for more than two weeks. Get help right away if:  You have pain in your arms, neck, jaw, teeth, or back.  You feel sweaty, dizzy, or light-headed.  You have chest pain or shortness of breath.  You vomit and your vomit looks like blood or coffee grounds.  Your stool is bloody or black. This information is not intended to replace advice given to you by your health care provider. Make sure you discuss any questions you have with your health care provider. Document Released:  01/17/2009 Document Revised: 02/06/2016 Document Reviewed: 12/26/2014 Elsevier Interactive Patient Education  2017 [employer]

## 2017-08-19 NOTE — Progress Notes (Signed)
Provider:  Gwenith Spitziffany L. Renato Gailseed, D.O., C.M.D. Location:   [facility]  Place of Service:   Clinic  Previous PCP: Kermit Baloeed, Johnwesley Lederman L, DO Patient Care Team: Kermit Baloeed, Naimah Yingst L, DO as PCP - General (Geriatric Medicine)  Extended Emergency Contact Information Primary Emergency Contact: [facility] At OakmontGuinn,Maryann Address: [address]          [place], [place] [address] [place] Mobile Phone: [phone] Relation: Spouse Secondary Emergency Contact: Regino BellowGuinn,Larry Address: [address]          [place], [place] [address] [place] Home Phone: [phone] Work Phone: [phone] Mobile Phone: [phone] Relation: Father  Code Status: full code Goals of Care: Advanced Directive information Advanced Directives 01/29/2017  Does Patient Have a Medical Advance Directive? No  Does patient want to make changes to medical advance directive? -  Would patient like information on creating a medical advance directive? No - Patient declined   Chief Complaint  Patient presents with  . Annual Exam    CPE    HPI: Patient is a 61 y.o. male seen today for an annual physical exam.  He's not doing cardio as well as he used to.  He hates to do cardio.  Chiropractor and massage therapist told him not to do much.  Will jog a mile and use the treadmill some.  He's more short of breath when he's working out.  Denies chest pain.      Throws out his lower back on the right side.  Has some genetics with it.    He's having more acid indigestion--every other day.  Not debilitating.  Asks about zantac.  Drinks wine, water and coffee.  Discussed elevating HOB and avoiding eating and drinking a couple of hrs before bed.    Liver tests have all been normal lately.    Just had his dysport from Dr. Terrace ArabiaYan.  Takes about a month for full benefits, he notes.  He was in the trial for dysport and that's what he's been on all along.  He's continuing to require quarterly injections.  Notes dad's memory continues to  decline.  His mother has been able to sit on the edge of the bed lately.  She's having conversations now.  She's wanting to get out of bed into a wheelchair.    Past Medical History:  Diagnosis Date  . Cervical dystonia   . Transaminitis    Past Surgical History:  Procedure Laterality Date  . TONSILLECTOMY  1963    reports that  has never smoked. he has never used smokeless tobacco. He reports that he drinks about 1.2 oz of alcohol per week. He reports that he does not use drugs.  Functional Status Survey:  independent in all adls, iadls, working, Psychologist, sport and exercisebusiness owner  Family History  Problem Relation Age of Onset  . Bipolar disorder Mother   . Dementia Mother   . Parkinson's disease Mother   . Memory loss Father     Health Maintenance  Topic Date Due  . Hepatitis C Screening  October 02, 1955  . HIV Screening  12/28/1972  . COLONOSCOPY  12/29/2007  . INFLUENZA VACCINE  04/14/2017  . TETANUS/TDAP  07/30/2026 (Originally 12/28/1976)    No Known Allergies  Outpatient Encounter Medications as of 08/19/2017  Medication Sig  . DYSPORT 500 units SOLR injection TO BE ADMINISTERED TO NECK MUSCLES EVERY 3 MONTHS   No facility-administered encounter medications on file as of 08/19/2017.     Review of Systems  Constitutional: Negative for chills,  fever and malaise/fatigue.  HENT: Negative for congestion and hearing loss.   Eyes: Negative for blurred vision.       Wears reading glasses  Respiratory: Negative for cough and shortness of breath.   Cardiovascular: Negative for chest pain, palpitations and leg swelling.  Gastrointestinal: Positive for heartburn. Negative for abdominal pain, blood in stool, constipation, diarrhea, melena, nausea and vomiting.  Genitourinary: Negative for dysuria.  Musculoskeletal: Positive for back pain, myalgias and neck pain. Negative for falls.  Skin: Negative for itching and rash.  Neurological: Negative for dizziness, loss of consciousness and weakness.    Endo/Heme/Allergies: Does not bruise/bleed easily.  Psychiatric/Behavioral: Negative for depression and memory loss. The patient is not nervous/anxious and does not have insomnia.     Vitals:   08/19/17 0914  BP: 128/80  Pulse: (!) 58  Temp: 98.1 F (36.7 C)  TempSrc: Oral  SpO2: 99%  Weight: 213 lb (96.6 kg)  Height: 5\' 8"  (1.727 m)   Body mass index is 32.39 kg/m. Physical Exam  Constitutional: He is oriented to person, place, and time. He appears well-developed and well-nourished. No distress.  HENT:  Head: Normocephalic and atraumatic.  Right Ear: External ear normal.  Left Ear: External ear normal.  Nose: Nose normal.  Mouth/Throat: Oropharynx is clear and moist. No oropharyngeal exudate.  Eyes: Conjunctivae and EOM are normal. Pupils are equal, round, and reactive to light.  Neck: Normal range of motion. Neck supple. No JVD present. No tracheal deviation present. No thyromegaly present.  Just had dysport last month  Cardiovascular: Normal rate, regular rhythm, normal heart sounds and intact distal pulses.  Pulmonary/Chest: Effort normal and breath sounds normal. No respiratory distress.  Abdominal: Soft. Bowel sounds are normal. He exhibits no distension. There is no tenderness.  Musculoskeletal: Normal range of motion. He exhibits no tenderness.  Lymphadenopathy:    He has no cervical adenopathy.  Neurological: He is alert and oriented to person, place, and time. No cranial nerve deficit.  Skin: Skin is warm and dry. Capillary refill takes less than 2 seconds.  Psychiatric: He has a normal mood and affect. His behavior is normal. Judgment and thought content normal.    Labs reviewed: Basic Metabolic Panel: Recent Labs    08/17/17 0812  NA 138  K 4.6  CL 104  CO2 29  GLUCOSE 99  BUN 17  CREATININE 1.19  CALCIUM 9.2   Liver Function Tests: Recent Labs    01/27/17 0819 08/17/17 0812  AST 20 28  ALT 17 24  ALKPHOS 56  --   BILITOT 0.5 0.9  PROT 6.7  6.7  ALBUMIN 4.2  --    No results for input(s): LIPASE, AMYLASE in the last 8760 hours. No results for input(s): AMMONIA in the last 8760 hours. CBC: Recent Labs    08/17/17 0812  WBC 4.2  NEUTROABS 2,617  HGB 14.3  HCT 42.1  MCV 89.4  PLT 283   Cardiac Enzymes: No results for input(s): CKTOTAL, CKMB, CKMBINDEX, TROPONINI in the last 8760 hours. BNP: Invalid input(s): POCBNP Lab Results  Component Value Date   HGBA1C 5.0 07/29/2016   Assessment/Plan 1. Cervical dystonia -under good control with dysport quarterly  2. Overweight (BMI 25.0-29.9) -ongoing based on bmi, but does eat a balanced diet and exercise regularly - Lipid panel; Future  3. Pure hypercholesterolemia -lipids at goal of LDL <100 -cont diet and exercise program  4. Transaminitis - resolved, seems it was a one time episode, continues to drink a  couple of glasses of wine per day - Hepatic function panel; Future  5. Gastroesophageal reflux disease without esophagitis -counseled on conservative changes to help with these symptoms, he's going to try prn zantac for this and let me know if it does not get better  6.  Annual exam:  cpe performed today, refuses flu shot here, never did cologuard as ordered  Labs/tests ordered:   Orders Placed This Encounter  Procedures  . Lipid panel    Standing Status:   Future    Standing Expiration Date:   08/19/2018  . Hepatic function panel    Standing Status:   Future    Standing Expiration Date:   08/19/2018   Next appt:  6 mos med mgt, labs before  Jaspal Pultz L. Kennadi Albany, D.O. Geriatrics [facility] [address] [place], [place] [address] Cell Phone (Mon-Fri 8am-5pm):  [phone] On Call:  [phone] & follow prompts after 5pm & weekends Office Phone:  [phone] Office Fax:  [phone]

## 2017-08-27 ENCOUNTER — Telehealth: Payer: Self-pay | Admitting: Neurology

## 2017-08-27 NOTE — Telephone Encounter (Signed)
Please call pt to r/s 10/27/17 4pm Inj. Appt. Thank you.

## 2017-08-30 NOTE — Telephone Encounter (Signed)
I called and rescheduled the patient.  °

## 2017-10-27 ENCOUNTER — Ambulatory Visit: Payer: BLUE CROSS/BLUE SHIELD | Admitting: Neurology

## 2017-11-03 ENCOUNTER — Encounter: Payer: Self-pay | Admitting: Neurology

## 2017-11-03 ENCOUNTER — Ambulatory Visit (INDEPENDENT_AMBULATORY_CARE_PROVIDER_SITE_OTHER): Payer: BLUE CROSS/BLUE SHIELD | Admitting: Neurology

## 2017-11-03 VITALS — BP 134/83 | HR 72 | Ht 68.0 in | Wt 216.0 lb

## 2017-11-03 DIAGNOSIS — G243 Spasmodic torticollis: Secondary | ICD-10-CM | POA: Diagnosis not present

## 2017-11-03 NOTE — Progress Notes (Signed)
PATIENT: Andrew Bentley DOB: September 16, 1955  HISTORICAL  Andrew Bentley is a 62 years old right-handed Caucasian male, came back for revisit, for potential EMG guided Botox injection for his cervical dystonia  He had a gradual onset neck turning to the left side, abnormal neck posture since 2005, progressively worse since 2008, he was enrolled in Dysport research trial sponsored by [employer] since 2013, he totally received 4 injection, last injection was November 2013, complete the study successfully in April 2014, which is his last office visit.   During the trial, he receive EMG guided dysport injection every 3 months, 500 units, he tolerated the injection very well, showed significant improvement of his neck posturing  He continue to works out regularly, he denies gait difficulty, he complains of left occipital area radiating pain to his left parietal area, he denies radiating pain from his neck to his arms, and hands  UPDATE Jan 20th 2016: He signed a consent form, also documented under communication dated October 03 2014, potential side effect explained, he had a bulky right sternocleidomastoid, left posterior cervical paraspinal muscles.  UPDATE February 13 2015: He responded very well to previous injection in October 03 2014,he received 500 units of Dysport, he can move his neck better, no significant side effect, no neck extension weakness, no swallowing difficulties  He has almost constant head turning to the left side even at the peak benefit of the Dysport injection  UPDATE May 11th 2017: Last injection was in Sept 2016, he received Dysport 500 units times 2, he had transient dysphagia, and dysarthria lasting for couple weeks, otherwise injection was very helpful, he continued to exercise regularly, receiving chiropractic adjustment regularly  Update April 29 2016:  He responded very well to previous injection Dysport 500 unitsx2 in May 2017, did reported side effect of mild transient  swallowing difficulty, weak voice, difficulty extending his neck,  Update September 03 2016, He responded very well to previous injectionsignificant side effect notice,  UPDATE Jan 13 2017: He had returning of frequent neck no no shaking movement, forceful pushing towards the left side, also lean towards his left shoulder, he has right posterior neck pain  Update April 21 2017: Responded well to previous injection, he has significant posterior neck muscle atrophy, did complains of mild hoarse voice after injection  UPDATE Jul 23 2017: He respond well to previous injection, recent onset of low back pain  UPDATE Nov 03 2017: He has no significant side effect from previous injection, complains of left side of the neck muscle tension,  REVIEW OF SYSTEMS: Full 14 system review of systems performed and notable only for as above  ALLERGIES: No Known Allergies  HOME MEDICATIONS: Current Outpatient Medications on File Prior to Visit  Medication Sig Dispense Refill  . DYSPORT 500 units SOLR injection TO BE ADMINISTERED TO NECK MUSCLES EVERY 3 MONTHS 2 each 3   No current facility-administered medications on file prior to visit.     PAST MEDICAL HISTORY: Past Medical History:  Diagnosis Date  . Cervical dystonia   . Transaminitis     PAST SURGICAL HISTORY: Past Surgical History:  Procedure Laterality Date  . TONSILLECTOMY  1963    FAMILY HISTORY: Family History  Problem Relation Age of Onset  . Bipolar disorder Mother   . Dementia Mother   . Parkinson's disease Mother   . Memory loss Father     SOCIAL HISTORY:  Social History   Socioeconomic History  . Marital status: Married  Spouse name: Not on file  . Number of children: 0  . Years of education: college  . Highest education level: Not on file  Social Needs  . Financial resource strain: Not on file  . Food insecurity - worry: Not on file  . Food insecurity - inability: Not on file  . Transportation needs -  medical: Not on file  . Transportation needs - non-medical: Not on file  Occupational History    Comment: Self [employer].  Tobacco Use  . Smoking status: Never Smoker  . Smokeless tobacco: Never Used  Substance and Sexual Activity  . Alcohol use: Yes    Alcohol/week: 1.2 oz    Types: 2 Glasses of wine per week    Comment: Red wine with dinner  . Drug use: No  . Sexual activity: Not on file  Other Topics Concern  . Not on file  Social History Narrative   Patient lives at home (one stories) with his wife [name], married since 1985    Patient works full time Market researcher.  [employer]   Education college education.   Right handed.   Caffeine two cups of coffee daily.   2 dogs, 1 cat   Exercises      PHYSICAL EXAM   Vitals:   11/03/17 1325  Height: 5\' 8"  (1.727 m)    Not recorded      Body mass index is 32.39 kg/m.   Generalized: In no acute distress  Neck: Supple, no carotid bruits   Cardiac: Regular rate rhythm  Pulmonary: Clear to auscultation bilaterally  Musculoskeletal: No deformity  Neurological examination  Mentation: Alert oriented to time, place, history taking, and causual conversation, he has moderate left neck turning, mild anterocollis, mild left tilt, mild left shoulder elevation, bulky posterior cervical paraspinal muscles, no difficulty turning    Cranial nerve II-XII: Pupils were equal round reactive to light. Extraocular movements were full.  Visual field were full on confrontational test. Bilateral fundi were sharp.  Facial sensation and strength were normal. Hearing was intact to finger rubbing bilaterally. Uvula tongue midline.  Head turning and shoulder shrug and were normal and symmetric.Tongue protrusion into cheek strength was normal.  Motor: Normal tone, bulk and strength.  Sensory: Intact to fine touch, pinprick, preserved vibratory sensation, and proprioception at toes.  Coordination: Normal finger to nose, heel-to-shin bilaterally there was  no truncal ataxia  Gait: Rising up from seated position without assistance, normal stance, without trunk ataxia, moderate stride, good arm swing, smooth turning, able to perform tiptoe, and heel walking without difficulty.   Romberg signs: Negative  Deep tendon reflexes: Brachioradialis 2/2, biceps 2/2, triceps 2/2, patellar 2/2, Achilles 2/2, plantar responses were flexor bilaterally.   DIAGNOSTIC DATA (LABS, IMAGING, TESTING) - I reviewed patient records, labs, notes, testing and imaging myself where available.  ASSESSMENT AND PLAN  Nikitas Davtyan is a 62 y.o. male with long-standing history of cervical dystonia, responded very well to previous EMG guided dysport injection.  He has significant left turning, mild anterocollis, moderate left laterocollis, mild left anterocollis, mild left shoulder elevation,    Under EMG guidance, 500 x2 =1,000 units of Dysport were injected (500 /2.5 cc for total of 5.0 cc  Left longissimus capitis 0. 5 cc   Left semispinalis 0.5 cc x2=1.0 cc Left splenius capitis 0.5 cc x2=1.0 cc Left inferior oblique capitus 0.5 cc Left  splenius cervix 0.5 cc x2= 1.0 CC  Right inferior oblique capitis 0.5 cc Right semispinalis 0.5 cc  Return to clinic in 3 months  Levert Feinstein, M.D. Ph.D.  [facility] [address] [place], [place] [address] [phone]

## 2017-11-03 NOTE — Progress Notes (Signed)
**  Dysport 500 units x 2 vials, NDC [address]-415054-0500-1, Lot [id number], Exp 01/11/2018, specialty pharmacy.//mck,rn**

## 2017-11-05 DIAGNOSIS — G243 Spasmodic torticollis: Secondary | ICD-10-CM | POA: Diagnosis not present

## 2018-01-31 DIAGNOSIS — G243 Spasmodic torticollis: Secondary | ICD-10-CM | POA: Diagnosis not present

## 2018-02-02 ENCOUNTER — Encounter: Payer: Self-pay | Admitting: Neurology

## 2018-02-02 ENCOUNTER — Ambulatory Visit (INDEPENDENT_AMBULATORY_CARE_PROVIDER_SITE_OTHER): Payer: BLUE CROSS/BLUE SHIELD | Admitting: Neurology

## 2018-02-02 VITALS — BP 138/98 | HR 71 | Ht 68.0 in | Wt 217.2 lb

## 2018-02-02 DIAGNOSIS — G243 Spasmodic torticollis: Secondary | ICD-10-CM

## 2018-02-02 NOTE — Progress Notes (Signed)
PATIENT: Andrew Bentley DOB: [birth year]  HISTORICAL  Andrew Bentley is a 62 years old right-handed Caucasian male, came back for revisit, for potential EMG guided Botox injection for his cervical dystonia  He had a gradual onset neck turning to the left side, abnormal neck posture since 2005, progressively worse since 2008, he was enrolled in Dysport research trial sponsored by [employer] since 2013, he totally received 4 injection, last injection was November 2013, complete the study successfully in April 2014, which is his last office visit.   During the trial, he receive EMG guided dysport injection every 3 months, 500 units, he tolerated the injection very well, showed significant improvement of his neck posturing  He continue to works out regularly, he denies gait difficulty, he complains of left occipital area radiating pain to his left parietal area, he denies radiating pain from his neck to his arms, and hands  UPDATE Jan 20th 2016: He signed a consent form, also documented under communication dated October 03 2014, potential side effect explained, he had a bulky right sternocleidomastoid, left posterior cervical paraspinal muscles.  UPDATE February 13 2015: He responded very well to previous injection in October 03 2014,he received 500 units of Dysport, he can move his neck better, no significant side effect, no neck extension weakness, no swallowing difficulties  He has almost constant head turning to the left side even at the peak benefit of the Dysport injection  UPDATE May 11th 2017: Last injection was in Sept 2016, he received Dysport 500 units times 2, he had transient dysphagia, and dysarthria lasting for couple weeks, otherwise injection was very helpful, he continued to exercise regularly, receiving chiropractic adjustment regularly  Update April 29 2016:  He responded very well to previous injection Dysport 500 unitsx2 in May 2017, did reported side effect of mild transient  swallowing difficulty, weak voice, difficulty extending his neck,  Update September 03 2016, He responded very well to previous injectionsignificant side effect notice,  UPDATE Jan 13 2017: He had returning of frequent neck no no shaking movement, forceful pushing towards the left side, also lean towards his left shoulder, he has right posterior neck pain  Update April 21 2017: Responded well to previous injection, he has significant posterior neck muscle atrophy, did complains of mild hoarse voice after injection  UPDATE Jul 23 2017: He respond well to previous injection, recent onset of low back pain  UPDATE Nov 03 2017: He has no significant side effect from previous injection, complains of left side of the neck muscle tension,  UPDATE Feb 02 2018: He responded very well to previous injection, no significant side effect noticed.   REVIEW OF SYSTEMS: Full 14 system review of systems performed and notable only for as above  ALLERGIES: No Known Allergies  HOME MEDICATIONS: Current Outpatient Medications on File Prior to Visit  Medication Sig Dispense Refill  . DYSPORT 500 units SOLR injection TO BE ADMINISTERED TO NECK MUSCLES EVERY 3 MONTHS 2 each 3   No current facility-administered medications on file prior to visit.     PAST MEDICAL HISTORY: Past Medical History:  Diagnosis Date  . Cervical dystonia   . Transaminitis     PAST SURGICAL HISTORY: Past Surgical History:  Procedure Laterality Date  . TONSILLECTOMY  1963    FAMILY HISTORY: Family History  Problem Relation Age of Onset  . Bipolar disorder Mother   . Dementia Mother   . Parkinson's disease Mother   . Memory loss Father  SOCIAL HISTORY:  Social History   Socioeconomic History  . Marital status: Married    Spouse name: Not on file  . Number of children: 0  . Years of education: college  . Highest education level: Not on file  Occupational History    Comment: Self [employer].  Social Needs  .  Financial resource strain: Not on file  . Food insecurity:    Worry: Not on file    Inability: Not on file  . Transportation needs:    Medical: Not on file    Non-medical: Not on file  Tobacco Use  . Smoking status: Never Smoker  . Smokeless tobacco: Never Used  Substance and Sexual Activity  . Alcohol use: Yes    Alcohol/week: 1.2 oz    Types: 2 Glasses of wine per week    Comment: Red wine with dinner  . Drug use: No  . Sexual activity: Not on file  Lifestyle  . Physical activity:    Days per week: Not on file    Minutes per session: Not on file  . Stress: Not on file  Relationships  . Social connections:    Talks on phone: Not on file    Gets together: Not on file    Attends religious service: Not on file    Active member of club or organization: Not on file    Attends meetings of clubs or organizations: Not on file    Relationship status: Not on file  . Intimate partner violence:    Fear of current or ex partner: Not on file    Emotionally abused: Not on file    Physically abused: Not on file    Forced sexual activity: Not on file  Other Topics Concern  . 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  [employer]   Education college education.   Right handed.   Caffeine two cups of coffee daily.   2 dogs, 1 cat   Exercises      PHYSICAL EXAM   Vitals:   02/02/18 1352  BP: (!) 138/98  Pulse: 71  Weight: 217 lb 4 oz (98.5 kg)  Height:  (1.727 m)    Not recorded      Body mass index is 33.03 kg/m.   Generalized: In no acute distress  Neck: Supple, no carotid bruits   Cardiac: Regular rate rhythm  Pulmonary: Clear to auscultation bilaterally  Musculoskeletal: No deformity  Neurological examination  Mentation: Alert oriented to time, place, history taking, and causual conversation, he has moderate left neck turning, mild  anterocollis, mild left tilt, mild left shoulder elevation, bulky posterior cervical paraspinal muscles, more on the left posterior neck, no difficulty turning    Cranial nerve II-XII: Pupils were equal round reactive to light. Extraocular movements were full.  Visual field were full on confrontational test. Bilateral fundi were sharp.  Facial sensation and strength were normal. Hearing was intact to finger rubbing bilaterally. Uvula tongue midline.  Head turning and shoulder shrug and were normal and symmetric.Tongue protrusion into cheek strength was normal.  Motor: Normal tone, bulk and strength.  Sensory: Intact to fine touch, pinprick, preserved vibratory sensation, and proprioception at toes.  Coordination: Normal finger to nose, heel-to-shin bilaterally there was no truncal ataxia  Gait: Rising up from seated position without assistance, normal stance, without trunk ataxia, moderate stride,  good arm swing, smooth turning, able to perform tiptoe, and heel walking without difficulty.   Romberg signs: Negative  Deep tendon reflexes: Brachioradialis 2/2, biceps 2/2, triceps 2/2, patellar 2/2, Achilles 2/2, plantar responses were flexor bilaterally.   DIAGNOSTIC DATA (LABS, IMAGING, TESTING) - I reviewed patient records, labs, notes, testing and imaging myself where available.  ASSESSMENT AND PLAN  Andrew Bentley is a 62 y.o. male with long-standing history of cervical dystonia, responded very well to previous EMG guided dysport injection.  He has significant left turning, mild anterocollis, moderate left laterocollis, mild left anterocollis, mild left shoulder elevation,    Under EMG guidance, 500 x2 =1,000 units of Dysport were injected (500 /2.5 cc for total of 5.0 cc  Left longissimus capitis 0. 5 cc   Left semispinalis 0.5 cc x2=1.0 cc Left splenius capitis 0.5 cc x2=1.0 cc Left inferior oblique capitus 0.5 cc Left  splenius cervix 0.5 cc x2= 1.0 CC  Right inferior oblique  capitis 0.5 cc  Right sternocleidomastoid 0.5 cc  Return to clinic in 3 months  Levert Feinstein, M.D. Ph.D.  [facility] [address] [place], [place] [address] [phone]

## 2018-02-02 NOTE — Progress Notes (Signed)
**  Dysport 500 units x 2 vials, NDC [address]-4, Lot [id number], Exp 08/13/2018, specialty pharmacy.//mck,rn**

## 2018-02-15 ENCOUNTER — Other Ambulatory Visit: Payer: BLUE CROSS/BLUE SHIELD

## 2018-02-15 DIAGNOSIS — R740 Nonspecific elevation of levels of transaminase and lactic acid dehydrogenase [LDH]: Secondary | ICD-10-CM | POA: Diagnosis not present

## 2018-02-15 DIAGNOSIS — E663 Overweight: Secondary | ICD-10-CM

## 2018-02-15 DIAGNOSIS — R7401 Elevation of levels of liver transaminase levels: Secondary | ICD-10-CM

## 2018-02-15 LAB — LIPID PANEL
Cholesterol: 195 mg/dL (ref <200)
HDL: 64 mg/dL (ref >40)
LDL Cholesterol (Calc): 115 mg/dL (calc) — ABNORMAL HIGH
Non-HDL Cholesterol (Calc): 131 mg/dL (calc) — ABNORMAL HIGH (ref <130)
Total CHOL/HDL Ratio: 3 (calc) (ref <5.0)
Triglycerides: 70 mg/dL (ref <150)

## 2018-02-15 LAB — HEPATIC FUNCTION PANEL
AG Ratio: 1.8 (calc) (ref 1.0–2.5)
ALT: 25 U/L (ref 9–46)
AST: 27 U/L (ref 10–35)
Albumin: 4.4 g/dL (ref 3.6–5.1)
Alkaline phosphatase (APISO): 59 U/L (ref 40–115)
Bilirubin, Direct: 0.1 mg/dL (ref 0.0–0.2)
Globulin: 2.5 g/dL (calc) (ref 1.9–3.7)
Indirect Bilirubin: 0.5 mg/dL (calc) (ref 0.2–1.2)
Total Bilirubin: 0.6 mg/dL (ref 0.2–1.2)
Total Protein: 6.9 g/dL (ref 6.1–8.1)

## 2018-02-17 ENCOUNTER — Ambulatory Visit (INDEPENDENT_AMBULATORY_CARE_PROVIDER_SITE_OTHER): Payer: BLUE CROSS/BLUE SHIELD | Admitting: Internal Medicine

## 2018-02-17 ENCOUNTER — Encounter: Payer: Self-pay | Admitting: Internal Medicine

## 2018-02-17 VITALS — BP 120/70 | HR 63 | Temp 98.4°F | Ht 68.0 in | Wt 217.0 lb

## 2018-02-17 DIAGNOSIS — G243 Spasmodic torticollis: Secondary | ICD-10-CM

## 2018-02-17 DIAGNOSIS — E7800 Pure hypercholesterolemia, unspecified: Secondary | ICD-10-CM | POA: Diagnosis not present

## 2018-02-17 DIAGNOSIS — K219 Gastro-esophageal reflux disease without esophagitis: Secondary | ICD-10-CM

## 2018-02-17 DIAGNOSIS — R7401 Elevation of levels of liver transaminase levels: Secondary | ICD-10-CM

## 2018-02-17 DIAGNOSIS — E663 Overweight: Secondary | ICD-10-CM | POA: Diagnosis not present

## 2018-02-17 DIAGNOSIS — R740 Nonspecific elevation of levels of transaminase and lactic acid dehydrogenase [LDH]: Secondary | ICD-10-CM | POA: Diagnosis not present

## 2018-02-17 NOTE — Progress Notes (Signed)
Location:  [facility] clinic Provider:  Aiyonna Lucado L. Renato Gails, D.O., C.M.D.  Code Status: full code Goals of Care:  Advanced Directives 02/17/2018  Does Patient Have a Medical Advance Directive? No  Does patient want to make changes to medical advance directive? -  Would patient like information on creating a medical advance directive? No - Patient declined     Chief Complaint  Patient presents with  . Medical Management of Chronic Issues    follow-up    HPI: Patient is a 62 y.o. male seen today for medical management of chronic diseases.    Says he can't workout as well as he used to.  Jogging is killing him--he hates it.  Knows he has to do it. Is doing this three times a week.  Sees the same personal trainer.    Still seeing neurology, Dr. Terrace Arabia, for his botox injections for his cervical dystonia.    Saw his eye doctor and has early cataracts.  LDL 115 up from 96.  Did travel to [place] and ate and drank there.  Loves his wine.   Talked about cheese and fried foods contributing.    Drinks 1-2 beers and he's done.  Wine intake--will easily drink 2-3 glasses each night.  He and his wife will watch tv and fall asleep.   Does have a little acid indigestion which he admits is likely due to his alcohol intake.  He had been taking vitamins from his chiropractor that he feels helped him and were based on his concerns with reflux.    Does not want his tdap today.    He reports being lax about getting colon cancer screening.  Still has the cologuard box.    Past Medical History:  Diagnosis Date  . Cervical dystonia   . Transaminitis     Past Surgical History:  Procedure Laterality Date  . TONSILLECTOMY  1963    No Known Allergies  Outpatient Encounter Medications as of 02/17/2018  Medication Sig  . DYSPORT 500 units SOLR injection TO BE ADMINISTERED TO NECK MUSCLES EVERY 3 MONTHS   No facility-administered encounter medications on file as of 02/17/2018.     Review of Systems:    Review of Systems  Constitutional: Negative for chills, diaphoresis, fever and malaise/fatigue.  HENT: Negative for congestion and hearing loss.   Eyes: Negative for blurred vision.       Early cataracts  Respiratory: Negative for cough and shortness of breath.   Cardiovascular: Negative for chest pain, palpitations and leg swelling.  Gastrointestinal: Positive for heartburn. Negative for abdominal pain, blood in stool, constipation, diarrhea, melena, nausea and vomiting.  Genitourinary: Negative for dysuria.  Musculoskeletal: Positive for myalgias and neck pain. Negative for falls.  Skin: Negative for itching and rash.  Neurological: Negative for dizziness and loss of consciousness.  Endo/Heme/Allergies: Does not bruise/bleed easily.  Psychiatric/Behavioral: Negative for memory loss. The patient is not nervous/anxious and does not have insomnia.     Health Maintenance  Topic Date Due  . Hepatitis C Screening  [birth year]  . HIV Screening  12/28/1972  . COLONOSCOPY  12/29/2007  . TETANUS/TDAP  07/30/2026 (Originally 12/28/1976)  . INFLUENZA VACCINE  04/14/2018    Physical Exam: Vitals:   02/17/18 0857  BP: 120/70  Pulse: 63  Temp: 98.4 F (36.9 C)  TempSrc: Oral  SpO2: 98%  Weight: 217 lb (98.4 kg)  Height: 5\' 8"  (1.727 m)   Body mass index is 32.99 kg/m. Physical Exam  Constitutional: He is  oriented to person, place, and time. He appears well-developed and well-nourished. No distress.  HENT:  Head: Normocephalic and atraumatic.  Cardiovascular: Normal rate, regular rhythm, normal heart sounds and intact distal pulses.  Pulmonary/Chest: Effort normal and breath sounds normal. No respiratory distress.  Abdominal: Bowel sounds are normal.  Musculoskeletal:  Decreased neck ROM  Neurological: He is alert and oriented to person, place, and time.  Skin: Skin is warm and dry.  Psychiatric: He has a normal mood and affect. His behavior is normal. Judgment and thought content  normal.    Labs reviewed: Basic Metabolic Panel: Recent Labs    08/17/17 0812  NA 138  K 4.6  CL 104  CO2 29  GLUCOSE 99  BUN 17  CREATININE 1.19  CALCIUM 9.2   Liver Function Tests: Recent Labs    08/17/17 0812 02/15/18 0841  AST 28 27  ALT 24 25  BILITOT 0.9 0.6  PROT 6.7 6.9   No results for input(s): LIPASE, AMYLASE in the last 8760 hours. No results for input(s): AMMONIA in the last 8760 hours. CBC: Recent Labs    08/17/17 0812  WBC 4.2  NEUTROABS 2,617  HGB 14.3  HCT 42.1  MCV 89.4  PLT 283   Lipid Panel: Recent Labs    08/17/17 0812 02/15/18 0841  CHOL 174 195  HDL 64 64  LDLCALC 96 115*  TRIG 58 70  CHOLHDL 2.7 3.0   Lab Results  Component Value Date   HGBA1C 5.0 07/29/2016   Assessment/Plan 1. Pure hypercholesterolemia - increase cardio, cut down on cheese and fried foods which seem to be the culprits for him -counseled on diet and exercise today, decreased wine intake - CBC with Differential/Platelet; Future - Lipid panel; Future - Basic metabolic panel; Future - Hepatic function panel; Future  2. Cervical dystonia -continue with botox (dysport) injections through neurology--these are quite effective each time  3. Overweight (BMI 25.0-29.9) -he is aware he should lose a few lbs -does cardio three times a week which he does not like doing -will work on diet also now since LDL trended up - Lipid panel; Future - Basic metabolic panel; Future  4. Gastroesophageal reflux disease without esophagitis -ongoing at times, plans to go back on a vitamin that he felt helped this--he will send me the name of this in mychart  5. Transaminitis -improved, I suspected due to his alcohol intake and could have some degree of fatty liver - Hepatic function panel; Future  Labs/tests ordered:   Orders Placed This Encounter  Procedures  . CBC with Differential/Platelet    Standing Status:   Future    Standing Expiration Date:   02/18/2019  .  Lipid panel    Standing Status:   Future    Standing Expiration Date:   02/18/2019    Order Specific Question:   Has the patient fasted?    Answer:   Yes  . Basic metabolic panel    Standing Status:   Future    Standing Expiration Date:   02/18/2019    Order Specific Question:   Has the patient fasted?    Answer:   Yes  . Hepatic function panel    Standing Status:   Future    Standing Expiration Date:   02/18/2019    Next appt:  6 mos cpe, labs before   Alecea Trego L. Nereida Schepp, D.O. Geriatrics [facility] [address] , [place] [id number] Cell Phone (Mon-Fri 8am-5pm):  [phone] On Call:  [phone] & follow prompts after 5pm & weekends Office Phone:  [phone] Office Fax:  [phone]

## 2018-02-17 NOTE — Patient Instructions (Addendum)
Get your cologuard done.  Check it for expiration before you "poop in the bucket".    Cut back on cheese, fried foods that are causing your LDL to trend up.  Continue to do your cardio and watch your wine--try not to drink more than 2 glasses per day.   Fat and Cholesterol Restricted Diet Getting too much fat and cholesterol in your diet may cause health problems. Following this diet helps keep your fat and cholesterol at normal levels. This can keep you from getting sick. What types of fat should I choose?  Choose monosaturated and polyunsaturated fats. These are found in foods such as olive oil, canola oil, flaxseeds, walnuts, almonds, and seeds.  Eat more omega-3 fats. Good choices include salmon, mackerel, sardines, tuna, flaxseed oil, and ground flaxseeds.  Limit saturated fats. These are in animal products such as meats, butter, and cream. They can also be in plant products such as palm oil, palm kernel oil, and coconut oil.  Avoid foods with partially hydrogenated oils in them. These contain trans fats. Examples of foods that have trans fats are stick margarine, some tub margarines, cookies, crackers, and other baked goods. What general guidelines do I need to follow?  Check food labels. Look for the words "trans fat" and "saturated fat."  When preparing a meal: ? Fill half of your plate with vegetables and green salads. ? Fill one fourth of your plate with whole grains. Look for the word "whole" as the first word in the ingredient list. ? Fill one fourth of your plate with lean protein foods.  Eat more foods that have fiber, like apples, carrots, beans, peas, and barley.  Eat more home-cooked foods. Eat less at restaurants and buffets.  Limit or avoid alcohol.  Limit foods high in starch and sugar.  Limit fried foods.  Cook foods without frying them. Baking, boiling, grilling, and broiling are all great options.  Lose weight if you are overweight. Losing even a small  amount of weight can help your overall health. It can also help prevent diseases such as diabetes and heart disease. What foods can I eat? Grains Whole grains, such as whole wheat or whole grain breads, crackers, cereals, and pasta. Unsweetened oatmeal, bulgur, barley, quinoa, or brown rice. Corn or whole wheat flour tortillas. Vegetables Fresh or frozen vegetables (raw, steamed, roasted, or grilled). Green salads. Fruits All fresh, canned (in natural juice), or frozen fruits. Meat and Other Protein Products Ground beef (85% or leaner), grass-fed beef, or beef trimmed of fat. Skinless chicken or [place]. Ground chicken or [place]. Pork trimmed of fat. All fish and seafood. Eggs. Dried beans, peas, or lentils. Unsalted nuts or seeds. Unsalted canned or dry beans. Dairy Low-fat dairy products, such as skim or 1% milk, 2% or reduced-fat cheeses, low-fat ricotta or cottage cheese, or plain low-fat yogurt. Fats and Oils Tub margarines without trans fats. Light or reduced-fat mayonnaise and salad dressings. Avocado. Olive, canola, sesame, or safflower oils. Natural peanut or almond butter (choose ones without added sugar and oil). The items listed above may not be a complete list of recommended foods or beverages. Contact your dietitian for more options. What foods are not recommended? Grains White bread. White pasta. White rice. Cornbread. Bagels, pastries, and croissants. Crackers that contain trans fat. Vegetables White potatoes. Corn. Creamed or fried vegetables. Vegetables in a cheese sauce. Fruits Dried fruits. Canned fruit in light or heavy syrup. Fruit juice. Meat and Other Protein Products Fatty cuts of meat. Ribs, chicken  wings, bacon, sausage, bologna, salami, chitterlings, fatback, hot dogs, bratwurst, and packaged luncheon meats. Liver and organ meats. Dairy Whole or 2% milk, cream, half-and-half, and cream cheese. Whole milk cheeses. Whole-fat or sweetened yogurt. Full-fat cheeses.  Nondairy creamers and whipped toppings. Processed cheese, cheese spreads, or cheese curds. Sweets and Desserts Corn syrup, sugars, honey, and molasses. Candy. Jam and jelly. Syrup. Sweetened cereals. Cookies, pies, cakes, donuts, muffins, and ice cream. Fats and Oils Butter, stick margarine, lard, shortening, ghee, or bacon fat. Coconut, palm kernel, or palm oils. Beverages Alcohol. Sweetened drinks (such as sodas, lemonade, and fruit drinks or punches). The items listed above may not be a complete list of foods and beverages to avoid. Contact your dietitian for more information. This information is not intended to replace advice given to you by your health care provider. Make sure you discuss any questions you have with your health care provider. Document Released: 03/01/2012 Document Revised: 05/07/2016 Document Reviewed: 11/30/2013 Elsevier Interactive Patient Education  [employer]2018 Elsevier Inc.

## 2018-04-29 ENCOUNTER — Telehealth: Payer: Self-pay | Admitting: Neurology

## 2018-04-29 ENCOUNTER — Other Ambulatory Visit: Payer: Self-pay | Admitting: Neurology

## 2018-04-29 MED ORDER — ABOBOTULINUMTOXINA 500 UNITS IM SOLR: INTRAMUSCULAR | 3 refills | Status: DC

## 2018-04-29 NOTE — Telephone Encounter (Signed)
Dysport rx. sent to [facility]

## 2018-04-29 NOTE — Telephone Encounter (Signed)
I called to request a refill on the patients Dysport.   We are out of refills, Andrew DusterMichelle, would you send an electronic script to [facility]?

## 2018-05-02 NOTE — Telephone Encounter (Signed)
Noted, thank you

## 2018-05-04 NOTE — Telephone Encounter (Signed)
I called to check status of the medication. It is still pending with their insurance department.

## 2018-05-05 NOTE — Telephone Encounter (Signed)
I called to check status and it is still pending insurance verification.

## 2018-05-09 NOTE — Telephone Encounter (Signed)
I called the pharmacy and the medication is ready, I called the patient to let him know he needed to call the pharmacy, he told me he would when he gets back to his office.

## 2018-05-10 DIAGNOSIS — G243 Spasmodic torticollis: Secondary | ICD-10-CM | POA: Diagnosis not present

## 2018-05-10 NOTE — Telephone Encounter (Signed)
I called the pharmacy to check status of delivery, they have still not spoken to the patient. I called the patient but he did not answer I left him a VM asking him to call me back today.

## 2018-05-10 NOTE — Telephone Encounter (Signed)
Patient called and stated he had not called the pharmacy yet. He said he would call them and see if they could over night the medication and then he will call me back.

## 2018-05-11 ENCOUNTER — Encounter: Payer: Self-pay | Admitting: Neurology

## 2018-05-11 ENCOUNTER — Ambulatory Visit (INDEPENDENT_AMBULATORY_CARE_PROVIDER_SITE_OTHER): Payer: BLUE CROSS/BLUE SHIELD | Admitting: Neurology

## 2018-05-11 VITALS — BP 131/86 | HR 69 | Ht 68.0 in | Wt 218.0 lb

## 2018-05-11 DIAGNOSIS — G243 Spasmodic torticollis: Secondary | ICD-10-CM | POA: Diagnosis not present

## 2018-05-11 NOTE — Progress Notes (Signed)
PATIENT: Kreig Parson DOB: [birth year]  HISTORICAL  Cecilio Ohlrich is a 62 years old right-handed Caucasian male, came back for revisit, for potential EMG guided Botox injection for his cervical dystonia  He had a gradual onset neck turning to the left side, abnormal neck posture since 2005, progressively worse since 2008, he was enrolled in Dysport research trial sponsored by [employer] since 2013, he totally received 4 injection, last injection was November 2013, complete the study successfully in April 2014, which is his last office visit.   During the trial, he receive EMG guided dysport injection every 3 months, 500 units, he tolerated the injection very well, showed significant improvement of his neck posturing  He continue to works out regularly, he denies gait difficulty, he complains of left occipital area radiating pain to his left parietal area, he denies radiating pain from his neck to his arms, and hands  UPDATE Jan 20th 2016: He signed a consent form, also documented under communication dated October 03 2014, potential side effect explained, he had a bulky right sternocleidomastoid, left posterior cervical paraspinal muscles.  UPDATE February 13 2015: He responded very well to previous injection in October 03 2014,he received 500 units of Dysport, he can move his neck better, no significant side effect, no neck extension weakness, no swallowing difficulties  He has almost constant head turning to the left side even at the peak benefit of the Dysport injection  UPDATE May 11th 2017: Last injection was in Sept 2016, he received Dysport 500 units times 2, he had transient dysphagia, and dysarthria lasting for couple weeks, otherwise injection was very helpful, he continued to exercise regularly, receiving chiropractic adjustment regularly  Update April 29 2016:  He responded very well to previous injection Dysport 500 unitsx2 in May 2017, did reported side effect of mild transient  swallowing difficulty, weak voice, difficulty extending his neck,  Update September 03 2016, He responded very well to previous injectionsignificant side effect notice,  UPDATE Jan 13 2017: He had returning of frequent neck no no shaking movement, forceful pushing towards the left side, also lean towards his left shoulder, he has right posterior neck pain  Update April 21 2017: Responded well to previous injection, he has significant posterior neck muscle atrophy, did complains of mild hoarse voice after injection  UPDATE Jul 23 2017: He respond well to previous injection, recent onset of low back pain  UPDATE Nov 03 2017: He has no significant side effect from previous injection, complains of left side of the neck muscle tension,  UPDATE Feb 02 2018: He responded very well to previous injection, no significant side effect noticed.  UPDATE May 12 2018: He is doing very well with his injection, workout regularly,   REVIEW OF SYSTEMS: Full 14 system review of systems performed and notable only for as above  ALLERGIES: No Known Allergies  HOME MEDICATIONS: Current Outpatient Medications on File Prior to Visit  Medication Sig Dispense Refill  . DYSPORT 500 units SOLR injection TO BE ADMINISTERED TO NECK MUSCLES EVERY 3 MONTHS 2 each 0   No current facility-administered medications on file prior to visit.     PAST MEDICAL HISTORY: Past Medical History:  Diagnosis Date  . Cervical dystonia   . Transaminitis     PAST SURGICAL HISTORY: Past Surgical History:  Procedure Laterality Date  . TONSILLECTOMY  1963    FAMILY HISTORY: Family History  Problem Relation Age of Onset  . Bipolar disorder Mother   . Dementia Mother   .  Parkinson's disease Mother   . Memory loss Father     SOCIAL HISTORY:  Social History   Socioeconomic History  . Marital status: Married    Spouse name: Not on file  . Number of children: 0  . Years of education: college  . Highest  education level: Not on file  Occupational History    Comment: Self [employer].  Social Needs  . Financial resource strain: Not on file  . Food insecurity:    Worry: Not on file    Inability: Not on file  . Transportation needs:    Medical: Not on file    Non-medical: Not on file  Tobacco Use  . Smoking status: Never Smoker  . Smokeless tobacco: Never Used  Substance and Sexual Activity  . Alcohol use: Yes    Alcohol/week: 2.0 standard drinks    Types: 2 Glasses of wine per week    Comment: Red wine with dinner  . Drug use: No  . Sexual activity: Not on file  Lifestyle  . Physical activity:    Days per week: Not on file    Minutes per session: Not on file  . Stress: Not on file  Relationships  . Social connections:    Talks on phone: Not on file    Gets together: Not on file    Attends religious service: Not on file    Active member of club or organization: Not on file    Attends meetings of clubs or organizations: Not on file    Relationship status: Not on file  . Intimate partner violence:    Fear of current or ex partner: Not on file    Emotionally abused: Not on file    Physically abused: Not on file    Forced sexual activity: Not on file  Other Topics Concern  . Not on file  Social History Narrative   Patient lives at home (one stories) with his wife [name], married since 1985    Patient works full time Market researcher.  [employer]   Education college education.   Right handed.   Caffeine two cups of coffee daily.   2 dogs, 1 cat   Exercises      PHYSICAL EXAM   Vitals:   05/11/18 1526  BP: 131/86  Pulse: 69  Weight: 218 lb (98.9 kg)  Height: 5\' 8"  (1.727 m)    Not recorded      Body mass index is 33.15 kg/m.   Generalized: In no acute distress  Neck: Supple, no carotid bruits   Cardiac: Regular rate rhythm  Pulmonary: Clear to auscultation bilaterally  Musculoskeletal: No deformity  Neurological examination  Mentation: Alert oriented to time,  place, history taking, and causual conversation, he has moderate left neck turning, mild anterocollis, mild left tilt, mild left shoulder elevation, bulky posterior cervical paraspinal muscles, more on the left posterior neck, no difficulty turning    Cranial nerve II-XII: Pupils were equal round reactive to light. Extraocular movements were full.  Visual field were full on confrontational test. Bilateral fundi were sharp.  Facial sensation and strength were normal. Hearing was intact to finger rubbing bilaterally. Uvula tongue midline.  Head turning and shoulder shrug and were normal and symmetric.Tongue protrusion into cheek strength was normal.  Motor: Normal tone, bulk and strength.  Sensory: Intact to fine touch, pinprick, preserved vibratory sensation, and proprioception at toes.  Coordination: Normal finger to nose, heel-to-shin bilaterally there was no truncal ataxia  Gait: Rising up from  seated position without assistance, normal stance, without trunk ataxia, moderate stride, good arm swing, smooth turning, able to perform tiptoe, and heel walking without difficulty.   Romberg signs: Negative  Deep tendon reflexes: Brachioradialis 2/2, biceps 2/2, triceps 2/2, patellar 2/2, Achilles 2/2, plantar responses were flexor bilaterally.   DIAGNOSTIC DATA (LABS, IMAGING, TESTING) - I reviewed patient records, labs, notes, testing and imaging myself where available.  ASSESSMENT AND PLAN  Patsy LagerBrian Williamson is a 62 y.o. male with long-standing history of cervical dystonia, responded very well to previous EMG guided dysport injection.  He has significant left turning, mild anterocollis, moderate left laterocollis, mild left anterocollis, mild left shoulder elevation,    Under EMG guidance, 500 x2 =1,000 units of Dysport were injected (500 /2.5 cc for total of 5.0 cc  Right longissimus capitis 0. 5 cc   Left semispinalis 0.5 cc x2=1.0 cc Right splenius capitis 0.5 cc x2=1.0 cc Left inferior  oblique capitus 0.5 cc Left  splenius cervix 0.5 cc x2= 1.0 CC  Right inferior oblique capitis 0.5 cc  Left levator scapular 0.5 cc  Return to clinic in 3 months  Levert FeinsteinYijun Rumor Sun, M.D. Ph.D.  [facility] [address], [address] [place], [place] [address] [phone]

## 2018-05-11 NOTE — Progress Notes (Signed)
**  Dysport 500 units x 2 vials, NDC [address]-815054-0500-1, Lot [id number], Exp 08/13/18, specialty pharmacy.//mck,rn**

## 2018-07-28 ENCOUNTER — Telehealth: Payer: Self-pay | Admitting: Neurology

## 2018-07-28 NOTE — Telephone Encounter (Signed)
Letter for Botox SP was sent.

## 2018-08-09 ENCOUNTER — Other Ambulatory Visit: Payer: Self-pay | Admitting: Neurology

## 2018-08-09 NOTE — Telephone Encounter (Signed)
I called to request a refill on his Dysport. DW

## 2018-08-15 ENCOUNTER — Telehealth: Payer: Self-pay | Admitting: Neurology

## 2018-08-15 NOTE — Telephone Encounter (Signed)
Pt has not been able to order the product and would like to see if the appt can be pushed back a week. Please call pt at [phone].

## 2018-08-16 NOTE — Telephone Encounter (Signed)
I called and spoke with the patient, he requested to move apt due to having meetings. DW

## 2018-08-17 ENCOUNTER — Ambulatory Visit: Payer: BLUE CROSS/BLUE SHIELD | Admitting: Neurology

## 2018-08-25 NOTE — Telephone Encounter (Signed)
I called and checked the status but it is still pending. DW

## 2018-08-30 NOTE — Telephone Encounter (Signed)
I called to check status of the medication, it was still pending. I asked to speak with a supervisor.

## 2018-08-31 ENCOUNTER — Encounter: Payer: Self-pay | Admitting: Neurology

## 2018-08-31 ENCOUNTER — Ambulatory Visit (INDEPENDENT_AMBULATORY_CARE_PROVIDER_SITE_OTHER): Payer: BLUE CROSS/BLUE SHIELD | Admitting: Neurology

## 2018-08-31 ENCOUNTER — Ambulatory Visit: Payer: BLUE CROSS/BLUE SHIELD | Admitting: Neurology

## 2018-08-31 VITALS — BP 139/90 | HR 75 | Ht 68.0 in | Wt 216.0 lb

## 2018-08-31 DIAGNOSIS — G243 Spasmodic torticollis: Secondary | ICD-10-CM | POA: Diagnosis not present

## 2018-08-31 NOTE — Progress Notes (Signed)
PATIENT: Andrew LagerBrian Weir DOB: 1956/01/26  HISTORICAL  Andrew LagerBrian Bentley is a 62 years old right-handed Caucasian male, came back for revisit, for potential EMG guided Botox injection for his cervical dystonia  He had a gradual onset neck turning to the left side, abnormal neck posture since 2005, progressively worse since 2008, he was enrolled in Dysport research trial sponsored by [employer] since 2013, he totally received 4 injection, last injection was November 2013, complete the study successfully in April 2014, which is his last office visit.   During the trial, he receive EMG guided dysport injection every 3 months, 500 units, he tolerated the injection very well, showed significant improvement of his neck posturing  He continue to works out regularly, he denies gait difficulty, he complains of left occipital area radiating pain to his left parietal area, he denies radiating pain from his neck to his arms, and hands  UPDATE Jan 20th 2016: He signed a consent form, also documented under communication dated October 03 2014, potential side effect explained, he had a bulky right sternocleidomastoid, left posterior cervical paraspinal muscles.  UPDATE February 13 2015: He responded very well to previous injection in October 03 2014,he received 500 units of Dysport, he can move his neck better, no significant side effect, no neck extension weakness, no swallowing difficulties  He has almost constant head turning to the left side even at the peak benefit of the Dysport injection  UPDATE May 11th 2017: Last injection was in Sept 2016, he received Dysport 500 units times 2, he had transient dysphagia, and dysarthria lasting for couple weeks, otherwise injection was very helpful, he continued to exercise regularly, receiving chiropractic adjustment regularly  Update April 29 2016:  He responded very well to previous injection Dysport 500 unitsx2 in May 2017, did reported side effect of mild transient  swallowing difficulty, weak voice, difficulty extending his neck,  Update September 03 2016, He responded very well to previous injectionsignificant side effect notice,  UPDATE Jan 13 2017: He had returning of frequent neck no no shaking movement, forceful pushing towards the left side, also lean towards his left shoulder, he has right posterior neck pain  Update April 21 2017: Responded well to previous injection, he has significant posterior neck muscle atrophy, did complains of mild hoarse voice after injection  UPDATE Jul 23 2017: He respond well to previous injection, recent onset of low back pain  UPDATE Nov 03 2017: He has no significant side effect from previous injection, complains of left side of the neck muscle tension,  UPDATE Feb 02 2018: He responded very well to previous injection, no significant side effect noticed.  UPDATE May 12 2018: He is doing very well with his injection, workout regularly,  UPDATE Aug 31 2018: He responded very well to previous injection, complains of right posterior neck muscle tension   REVIEW OF SYSTEMS: Full 14 system review of systems performed and notable only for as above  ALLERGIES: No Known Allergies  HOME MEDICATIONS: Current Outpatient Medications on File Prior to Visit  Medication Sig Dispense Refill  . DYSPORT 500 units SOLR injection TO BE ADMINISTERED TO NECK MUSCLES EVERY 3 MONTHS 2 each 0   No current facility-administered medications on file prior to visit.     PAST MEDICAL HISTORY: Past Medical History:  Diagnosis Date  . Cervical dystonia   . Transaminitis     PAST SURGICAL HISTORY: Past Surgical History:  Procedure Laterality Date  . TONSILLECTOMY  1963    FAMILY  HISTORY: Family History  Problem Relation Age of Onset  . Bipolar disorder Mother   . Dementia Mother   . Parkinson's disease Mother   . Memory loss Father     SOCIAL HISTORY:  Social History   Socioeconomic History  . Marital  status: Married    Spouse name: Not on file  . Number of children: 0  . Years of education: college  . Highest education level: Not on file  Occupational History    Comment: Self Emp.  Social Needs  . Financial resource strain: Not on file  . Food insecurity:    Worry: Not on file    Inability: Not on file  . Transportation needs:    Medical: Not on file    Non-medical: Not on file  Tobacco Use  . Smoking status: Never Smoker  . Smokeless tobacco: Never Used  Substance and Sexual Activity  . Alcohol use: Yes    Alcohol/week: 2.0 standard drinks    Types: 2 Glasses of wine per week    Comment: Red wine with dinner  . Drug use: No  . Sexual activity: Not on file  Lifestyle  . Physical activity:    Days per week: Not on file    Minutes per session: Not on file  . Stress: Not on file  Relationships  . Social connections:    Talks on phone: Not on file    Gets together: Not on file    Attends religious service: Not on file    Active member of club or organization: Not on file    Attends meetings of clubs or organizations: Not on file    Relationship status: Not on file  . Intimate partner violence:    Fear of current or ex partner: Not on file    Emotionally abused: Not on file    Physically abused: Not on file    Forced sexual activity: Not on file  Other Topics Concern  . Not on file  Social History Narrative   Patient lives at home (one stories) with his wife [name], married since 1985    Patient works full time Market researcher.  [employer]   Education college education.   Right handed.   Caffeine two cups of coffee daily.   2 dogs, 1 cat   Exercises      PHYSICAL EXAM   Vitals:   08/31/18 1310  BP: 139/90  Pulse: 75  Weight: 216 lb (98 kg)  Height: 5\' 8"  (1.727 m)    Not recorded      Body mass index is 32.84 kg/m.   Generalized: In no acute distress  Neck: Supple, no carotid bruits   Cardiac: Regular rate rhythm  Pulmonary: Clear to auscultation  bilaterally  Musculoskeletal: No deformity  Neurological examination  Mentation: Alert oriented to time, place, history taking, and causual conversation, he has moderate left neck turning, mild anterocollis, mild left tilt, mild left shoulder elevation, bulky posterior cervical paraspinal muscles, more on the left posterior neck, no difficulty turning    Cranial nerve II-XII: Pupils were equal round reactive to light. Extraocular movements were full.  Visual field were full on confrontational test. Bilateral fundi were sharp.  Facial sensation and strength were normal. Hearing was intact to finger rubbing bilaterally. Uvula tongue midline.  Head turning and shoulder shrug and were normal and symmetric.Tongue protrusion into cheek strength was normal.  Motor: Normal tone, bulk and strength.  Sensory: Intact to fine touch, pinprick, preserved vibratory sensation,  and proprioception at toes.  Coordination: Normal finger to nose, heel-to-shin bilaterally there was no truncal ataxia  Gait: Rising up from seated position without assistance, normal stance, without trunk ataxia, moderate stride, good arm swing, smooth turning, able to perform tiptoe, and heel walking without difficulty.   Romberg signs: Negative  Deep tendon reflexes: Brachioradialis 2/2, biceps 2/2, triceps 2/2, patellar 2/2, Achilles 2/2, plantar responses were flexor bilaterally.   DIAGNOSTIC DATA (LABS, IMAGING, TESTING) - I reviewed patient records, labs, notes, testing and imaging myself where available.  ASSESSMENT AND PLAN  Vishwa Dais is a 62 y.o. male with long-standing history of cervical dystonia, responded very well to previous EMG guided dysport injection.  He has significant left turning, mild retrocollis, moderate right laterocollis, mild left shoulder elevation,    Under EMG guidance, 500 x2 =1,000 units of Dysport were injected (500 /2.5 cc for total of 5.0 cc)  Left longissimus capitis 0. 5 cc   Left  semispinalis 0.5 cc  Left splenius capitis 0.5 ccx2=1.0 Left splenius cervix 0.5 ccx2=1.0  Right splenius capitis 0.5 cc  Left inferior oblique capitus 0.5 cc Right inferior oblique capitis 0.5 cc  Left levator scapular 0.5 cc  Return to clinic in 3 months  Levert Feinstein, M.D. Ph.D.  [facility] [address], [address] [place], [place] [address] [phone]

## 2018-08-31 NOTE — Progress Notes (Signed)
**  Dysport 500 units x 2 vials, NDC [id number], Lot [id number], Exp 04/14/2019, office supply.//mck,rn**

## 2018-09-01 DIAGNOSIS — G243 Spasmodic torticollis: Secondary | ICD-10-CM | POA: Diagnosis not present

## 2018-09-01 MED ORDER — ABOBOTULINUMTOXINA 500 UNITS IM SOLR
1000.0000 [IU] | Freq: Once | INTRAMUSCULAR | Status: AC
  Administered 2018-09-01: 1000 [IU] via INTRAMUSCULAR

## 2018-09-26 ENCOUNTER — Encounter: Payer: Self-pay | Admitting: Internal Medicine

## 2018-09-26 ENCOUNTER — Ambulatory Visit (INDEPENDENT_AMBULATORY_CARE_PROVIDER_SITE_OTHER): Payer: BLUE CROSS/BLUE SHIELD | Admitting: Internal Medicine

## 2018-09-26 VITALS — BP 120/70 | HR 78 | Temp 98.6°F | Ht 68.0 in | Wt 211.0 lb

## 2018-09-26 DIAGNOSIS — Z6832 Body mass index (BMI) 32.0-32.9, adult: Secondary | ICD-10-CM

## 2018-09-26 DIAGNOSIS — E6609 Other obesity due to excess calories: Secondary | ICD-10-CM | POA: Diagnosis not present

## 2018-09-26 DIAGNOSIS — Z789 Other specified health status: Secondary | ICD-10-CM | POA: Insufficient documentation

## 2018-09-26 DIAGNOSIS — E7800 Pure hypercholesterolemia, unspecified: Secondary | ICD-10-CM | POA: Diagnosis not present

## 2018-09-26 DIAGNOSIS — Z7289 Other problems related to lifestyle: Secondary | ICD-10-CM

## 2018-09-26 DIAGNOSIS — R7401 Elevation of levels of liver transaminase levels: Secondary | ICD-10-CM

## 2018-09-26 DIAGNOSIS — K219 Gastro-esophageal reflux disease without esophagitis: Secondary | ICD-10-CM | POA: Diagnosis not present

## 2018-09-26 DIAGNOSIS — R740 Nonspecific elevation of levels of transaminase and lactic acid dehydrogenase [LDH]: Secondary | ICD-10-CM

## 2018-09-26 DIAGNOSIS — F1090 Alcohol use, unspecified, uncomplicated: Secondary | ICD-10-CM | POA: Insufficient documentation

## 2018-09-26 NOTE — Progress Notes (Signed)
Location:  [facility] clinic Provider:  Tyjai Charbonnet L. Renato Gails, D.O., C.M.D.  Goals of Care:  Advanced Directives 02/17/2018  Does Patient Have a Medical Advance Directive? No  Does patient want to make changes to medical advance directive? -  Would patient like information on creating a medical advance directive? No - Patient declined   Chief Complaint  Patient presents with  . Medical Management of Chronic Issues    follow-up    HPI: Patient is a 63 y.o. male seen today for medical management of chronic diseases.    He's been taking some prevacid for gerd occasionally.    He is doing ok.  He's 63 yo and does not have the energy he used to--he's still working out like he used to and still drinking wine.  He's eating like crap.  Agrees I'm probably right that his food choices are not helping him feel like he does.  Hard to eat right when traveling.    His wife is taking isogenix supplement shakes and protein bars.  He asks about this.  His trainer has suggested a bigger meal for lunch and smaller amounts at dinner.    His weight is actually down 5 lbs.  He does fast walking on the treadmill at incline--1.5 mile walk and weights for different areas.  There is more stress as they've acquired another company.  He's been traveling a lot.    He is drinking the bulletproof coffee first thing in the morning.    Past Medical History:  Diagnosis Date  . Cervical dystonia   . Transaminitis     Past Surgical History:  Procedure Laterality Date  . TONSILLECTOMY  1963    No Known Allergies  Outpatient Encounter Medications as of 09/26/2018  Medication Sig  . DYSPORT 500 units SOLR injection TO BE ADMINISTERED TO NECK MUSCLES EVERY 3 MONTHS   No facility-administered encounter medications on file as of 09/26/2018.     Review of Systems:  Review of Systems  Constitutional: Positive for weight loss. Negative for chills, fever and malaise/fatigue.  HENT: Negative for congestion and hearing  loss.   Eyes: Negative for blurred vision.  Respiratory: Negative for cough and shortness of breath.   Cardiovascular: Negative for chest pain, palpitations and leg swelling.  Gastrointestinal: Positive for heartburn. Negative for abdominal pain, blood in stool, constipation, diarrhea, melena, nausea and vomiting.  Genitourinary: Negative for dysuria.  Musculoskeletal: Negative for falls and joint pain.  Skin: Negative for itching and rash.  Neurological: Positive for tremors. Negative for dizziness, sensory change and loss of consciousness.       Gets head tremor, torticollis, gets his botox injections thru neurology  Endo/Heme/Allergies: Does not bruise/bleed easily.  Psychiatric/Behavioral: Negative for depression and memory loss. The patient is not nervous/anxious and does not have insomnia.        Increased stress    Health Maintenance  Topic Date Due  . Hepatitis C Screening  March 12, 1956  . HIV Screening  12/28/1972  . COLONOSCOPY  12/29/2007  . INFLUENZA VACCINE  04/14/2018  . TETANUS/TDAP  07/30/2026 (Originally 12/28/1976)    Physical Exam: Vitals:   09/26/18 1024  BP: 120/70  Pulse: 78  Temp: 98.6 F (37 C)  TempSrc: Oral  SpO2: 97%  Weight: 211 lb (95.7 kg)  Height: 5\' 8"  (1.727 m)   Body mass index is 32.08 kg/m. Physical Exam Vitals signs reviewed.  Constitutional:      Appearance: Normal appearance.  HENT:  Head: Normocephalic and atraumatic.  Neck:     Musculoskeletal: Neck supple.  Cardiovascular:     Rate and Rhythm: Normal rate and regular rhythm.     Pulses: Normal pulses.     Heart sounds: Normal heart sounds. No murmur. No friction rub. No gallop.   Pulmonary:     Effort: Pulmonary effort is normal. No respiratory distress.     Breath sounds: Normal breath sounds. No wheezing, rhonchi or rales.  Abdominal:     General: Bowel sounds are normal.  Musculoskeletal: Normal range of motion.        General: No swelling or tenderness.  Skin:     General: Skin is warm and dry.  Neurological:     Mental Status: He is alert and oriented to person, place, and time.  Psychiatric:        Mood and Affect: Mood normal.        Behavior: Behavior normal.        Thought Content: Thought content normal.     Labs reviewed: Basic Metabolic Panel: Recent Labs    09/28/18 0807  NA 138  K 4.4  CL 103  CO2 30  GLUCOSE 94  BUN 16  CREATININE 1.24  CALCIUM 9.3   Liver Function Tests: Recent Labs    02/15/18 0841 09/28/18 0807  AST 27 22  ALT 25 16  BILITOT 0.6 0.5  PROT 6.9 6.5   No results for input(s): LIPASE, AMYLASE in the last 8760 hours. No results for input(s): AMMONIA in the last 8760 hours. CBC: Recent Labs    09/28/18 0807  WBC 4.4  NEUTROABS 2,785  HGB 13.9  HCT 41.6  MCV 90.2  PLT 298   Lipid Panel: Recent Labs    02/15/18 0841 09/28/18 0807  CHOL 195 161  HDL 64 54  LDLCALC 115* 92  TRIG 70 60  CHOLHDL 3.0 3.0   Lab Results  Component Value Date   HGBA1C 5.0 07/29/2016   Assessment/Plan 1. Pure hypercholesterolemia -f/u labs; does not want to take med for cholesterol--reinforced necessary diet changes to prevent this and exercise program  2. BMI 32.0-32.9,adult -dietary counseling and exercising counseling provided  3. Class 1 obesity due to excess calories with serious comorbidity and body mass index (BMI) of 32.0 to 32.9 in adult -counseled on weight loss and benefits of ongoing exercise for this and memory preservation (with dad's dementia), also improve diet with less fatty foods, sweets and wine  4. Gastroesophageal reflux disease without esophagitis -affected by overindulging with wine and food -educated on topic, conservative mgt, using prevacid  5. Transaminitis -f/u labs to see where this is; last wnl but has historically been concerning and attributed to #6  6. Alcohol use -encouraged him to reduce his consumption--seems to binge on the weekends with his wine intake as many  as 5 glasses in a day--advised to reduce to 2 max  Labs/tests ordered:   Orders Placed This Encounter  Procedures  . COMPLETE METABOLIC PANEL WITH GFR    Standing Status:   Future    Standing Expiration Date:   10/03/2019  . Lipid panel    Standing Status:   Future    Standing Expiration Date:   10/03/2019    Order Specific Question:   Has the patient fasted?    Answer:   Yes  . Hemoglobin A1c    Standing Status:   Future    Standing Expiration Date:   10/03/2019     Next  appt: 6 mos CPE, fasting labs before  Nysa Sarin L. Lajoyce Tamura, D.O. Geriatrics [facility] [address] [place], [place] [address] Cell Phone (Mon-Fri 8am-5pm):  [phone] On Call:  [phone] & follow prompts after 5pm & weekends Office Phone:  [phone] Office Fax:  [phone]

## 2018-09-26 NOTE — Patient Instructions (Addendum)
Please get fasting labs asap.    Fat and Cholesterol Restricted Eating Plan Getting too much fat and cholesterol in your diet may cause health problems. Choosing the right foods helps keep your fat and cholesterol at normal levels. This can keep you from getting certain diseases. What are tips for following this plan? Meal planning  At meals, divide your plate into four equal parts: ? Fill one-half of your plate with vegetables and green salads. ? Fill one-fourth of your plate with whole grains. ? Fill one-fourth of your plate with low-fat (lean) protein foods.  Eat fish that is high in omega-3 fats at least two times a week. This includes mackerel, tuna, sardines, and salmon.  Eat foods that are high in fiber, such as whole grains, beans, apples, broccoli, carrots, peas, and barley. General tips   Work with your doctor to lose weight if you need to.  Avoid: ? Foods with added sugar. ? Fried foods. ? Foods with partially hydrogenated oils.  Limit alcohol intake to no more than 1 drink a day for nonpregnant women and 2 drinks a day for men. One drink equals 12 oz of beer, 5 oz of wine, or 1 oz of hard liquor. Reading food labels  Check food labels for: ? Trans fats. ? Partially hydrogenated oils. ? Saturated fat (g) in each serving. ? Cholesterol (mg) in each serving. ? Fiber (g) in each serving.  Choose foods with healthy fats, such as: ? Monounsaturated fats. ? Polyunsaturated fats. ? Omega-3 fats.  Choose grain products that have whole grains. Look for the word "whole" as the first word in the ingredient list. Cooking  Cook foods using low-fat methods. These include baking, boiling, grilling, and broiling.  Eat more home-cooked foods. Eat at restaurants and buffets less often.  Avoid cooking using saturated fats, such as butter, cream, palm oil, palm kernel oil, and coconut oil. Recommended foods  Fruits  All fresh, canned (in natural juice), or frozen  fruits. Vegetables  Fresh or frozen vegetables (raw, steamed, roasted, or grilled). Green salads. Grains  Whole grains, such as whole wheat or whole grain breads, crackers, cereals, and pasta. Unsweetened oatmeal, bulgur, barley, quinoa, or brown rice. Corn or whole wheat flour tortillas. Meats and other protein foods  Ground beef (85% or leaner), grass-fed beef, or beef trimmed of fat. Skinless chicken or [place]. Ground chicken or [place]key. Pork trimmed of fat. All fish and seafood. Egg whites. Dried beans, peas, or lentils. Unsalted nuts or seeds. Unsalted canned beans. Nut butters without added sugar or oil. Dairy  Low-fat or nonfat dairy products, such as skim or 1% milk, 2% or reduced-fat cheeses, low-fat and fat-free ricotta or cottage cheese, or plain low-fat and nonfat yogurt. Fats and oils  Tub margarine without trans fats. Light or reduced-fat mayonnaise and salad dressings. Avocado. Olive, canola, sesame, or safflower oils. The items listed above may not be a complete list of foods and beverages you can eat. Contact a dietitian for more information. Foods to avoid Fruits  Canned fruit in heavy syrup. Fruit in cream or butter sauce. Fried fruit. Vegetables  Vegetables cooked in cheese, cream, or butter sauce. Fried vegetables. Grains  White bread. White pasta. White rice. Cornbread. Bagels, pastries, and croissants. Crackers and snack foods that contain trans fat and hydrogenated oils. Meats and other protein foods  Fatty cuts of meat. Ribs, chicken wings, bacon, sausage, bologna, salami, chitterlings, fatback, hot dogs, bratwurst, and packaged lunch meats. Liver and organ meats. Whole eggs  and egg yolks. Chicken and [place]key with skin. Fried meat. Dairy  Whole or 2% milk, cream, half-and-half, and cream cheese. Whole milk cheeses. Whole-fat or sweetened yogurt. Full-fat cheeses. Nondairy creamers and whipped toppings. Processed cheese, cheese spreads, and cheese  curds. Beverages  Alcohol. Sugar-sweetened drinks such as sodas, lemonade, and fruit drinks. Fats and oils  Butter, stick margarine, lard, shortening, ghee, or bacon fat. Coconut, palm kernel, and palm oils. Sweets and desserts  Corn syrup, sugars, honey, and molasses. Candy. Jam and jelly. Syrup. Sweetened cereals. Cookies, pies, cakes, donuts, muffins, and ice cream. The items listed above may not be a complete list of foods and beverages you should avoid. Contact a dietitian for more information. Summary  Choosing the right foods helps keep your fat and cholesterol at normal levels. This can keep you from getting certain diseases.  At meals, fill one-half of your plate with vegetables and green salads.  Eat high-fiber foods, like whole grains, beans, apples, carrots, peas, and barley.  Limit added sugar, saturated fats, alcohol, and fried foods. This information is not intended to replace advice given to you by your health care provider. Make sure you discuss any questions you have with your health care provider. Document Released: 03/01/2012 Document Revised: 05/04/2018 Document Reviewed: 05/18/2017 Elsevier Interactive Patient Education  2019 [employer]

## 2018-09-28 ENCOUNTER — Other Ambulatory Visit: Payer: BLUE CROSS/BLUE SHIELD

## 2018-09-28 DIAGNOSIS — R740 Nonspecific elevation of levels of transaminase and lactic acid dehydrogenase [LDH]: Secondary | ICD-10-CM

## 2018-09-28 DIAGNOSIS — R7401 Elevation of levels of liver transaminase levels: Secondary | ICD-10-CM

## 2018-09-28 DIAGNOSIS — E7800 Pure hypercholesterolemia, unspecified: Secondary | ICD-10-CM

## 2018-09-28 DIAGNOSIS — E663 Overweight: Secondary | ICD-10-CM

## 2018-09-28 LAB — CBC WITH DIFFERENTIAL/PLATELET
Absolute Monocytes: 356 cells/uL (ref 200–950)
Basophils Absolute: 40 cells/uL (ref 0–200)
Basophils Relative: 0.9 %
Eosinophils Absolute: 141 cells/uL (ref 15–500)
Eosinophils Relative: 3.2 %
HCT: 41.6 % (ref 38.5–50.0)
Hemoglobin: 13.9 g/dL (ref 13.2–17.1)
Lymphs Abs: 1078 cells/uL (ref 850–3900)
MCH: 30.2 pg (ref 27.0–33.0)
MCHC: 33.4 g/dL (ref 32.0–36.0)
MCV: 90.2 fL (ref 80.0–100.0)
MPV: 10.6 fL (ref 7.5–12.5)
Monocytes Relative: 8.1 %
Neutro Abs: 2785 cells/uL (ref 1500–7800)
Neutrophils Relative %: 63.3 %
Platelets: 298 10*3/uL (ref 140–400)
RBC: 4.61 10*6/uL (ref 4.20–5.80)
RDW: 12.3 % (ref 11.0–15.0)
Total Lymphocyte: 24.5 %
WBC: 4.4 10*3/uL (ref 3.8–10.8)

## 2018-09-28 LAB — BASIC METABOLIC PANEL
BUN: 16 mg/dL (ref 7–25)
CO2: 30 mmol/L (ref 20–32)
Calcium: 9.3 mg/dL (ref 8.6–10.3)
Chloride: 103 mmol/L (ref 98–110)
Creat: 1.24 mg/dL (ref 0.70–1.25)
Glucose, Bld: 94 mg/dL (ref 65–99)
Potassium: 4.4 mmol/L (ref 3.5–5.3)
Sodium: 138 mmol/L (ref 135–146)

## 2018-09-28 LAB — LIPID PANEL
Cholesterol: 161 mg/dL (ref <200)
HDL: 54 mg/dL (ref >40)
LDL Cholesterol (Calc): 92 mg/dL (calc)
Non-HDL Cholesterol (Calc): 107 mg/dL (calc) (ref <130)
Total CHOL/HDL Ratio: 3 (calc) (ref <5.0)
Triglycerides: 60 mg/dL (ref <150)

## 2018-09-28 LAB — HEPATIC FUNCTION PANEL
AG Ratio: 1.7 (calc) (ref 1.0–2.5)
ALT: 16 U/L (ref 9–46)
AST: 22 U/L (ref 10–35)
Albumin: 4.1 g/dL (ref 3.6–5.1)
Alkaline phosphatase (APISO): 52 U/L (ref 40–115)
Bilirubin, Direct: 0.1 mg/dL (ref 0.0–0.2)
Globulin: 2.4 g/dL (calc) (ref 1.9–3.7)
Indirect Bilirubin: 0.4 mg/dL (calc) (ref 0.2–1.2)
Total Bilirubin: 0.5 mg/dL (ref 0.2–1.2)
Total Protein: 6.5 g/dL (ref 6.1–8.1)

## 2018-11-28 ENCOUNTER — Telehealth: Payer: Self-pay | Admitting: Neurology

## 2018-11-28 NOTE — Telephone Encounter (Signed)
Pt canceled 3/18 appt due to the [facility] Virus situation but would like a call back to reschedule his injection. Please advise.

## 2018-11-28 NOTE — Telephone Encounter (Signed)
Spoke to patient - he is currently in [place].  He would like to move his appt our several weeks.  He has been rescheduled to 01/12/2019.

## 2018-11-29 NOTE — Telephone Encounter (Signed)
[facility] called and relayed they have been trying to call patient about his Dysport patient assistance.  Patient needs to call [phone] . Before his apt.  I also called and left patient a message as well. Thanks Annabelle Harman.

## 2018-11-30 ENCOUNTER — Ambulatory Visit: Payer: Self-pay | Admitting: Neurology

## 2018-12-05 NOTE — Telephone Encounter (Signed)
Noted, thank you. DW  °

## 2019-01-12 ENCOUNTER — Ambulatory Visit: Payer: Self-pay | Admitting: Neurology

## 2019-01-25 ENCOUNTER — Encounter: Payer: Self-pay | Admitting: Neurology

## 2019-01-25 ENCOUNTER — Ambulatory Visit (INDEPENDENT_AMBULATORY_CARE_PROVIDER_SITE_OTHER): Payer: 59 | Admitting: Neurology

## 2019-01-25 ENCOUNTER — Other Ambulatory Visit: Payer: Self-pay

## 2019-01-25 VITALS — BP 140/86 | HR 64 | Temp 97.5°F | Ht 68.0 in | Wt 220.5 lb

## 2019-01-25 DIAGNOSIS — G243 Spasmodic torticollis: Secondary | ICD-10-CM | POA: Diagnosis not present

## 2019-01-25 MED ORDER — ABOBOTULINUMTOXINA 500 UNITS IM SOLR
1000.0000 [IU] | Freq: Once | INTRAMUSCULAR | Status: AC
  Administered 2019-01-25: 1000 [IU] via INTRAMUSCULAR

## 2019-01-25 NOTE — Progress Notes (Signed)
**  Dysport 500 units x 2 vials, NDC [address]-9, Lot [id number], Exp 07/15/2019, office supply.//mck,rn**

## 2019-01-25 NOTE — Progress Notes (Signed)
PATIENT: Patsy LagerBrian Deroos DOB: 04-19-1956  HISTORICAL  Patsy LagerBrian Branum is a 63 years old right-handed Caucasian male, came back for revisit, for potential EMG guided Botox injection for his cervical dystonia  He had a gradual onset neck turning to the left side, abnormal neck posture since 2005, progressively worse since 2008, he was enrolled in Dysport research trial sponsored by [employer] since 2013, he totally received 4 injection, last injection was November 2013, complete the study successfully in April 2014, which is his last office visit.   During the trial, he receive EMG guided dysport injection every 3 months, 500 units, he tolerated the injection very well, showed significant improvement of his neck posturing  He continue to works out regularly, he denies gait difficulty, he complains of left occipital area radiating pain to his left parietal area, he denies radiating pain from his neck to his arms, and hands  UPDATE Jan 20th 2016: He signed a consent form, also documented under communication dated October 03 2014, potential side effect explained, he had a bulky right sternocleidomastoid, left posterior cervical paraspinal muscles.  UPDATE February 13 2015: He responded very well to previous injection in October 03 2014,he received 500 units of Dysport, he can move his neck better, no significant side effect, no neck extension weakness, no swallowing difficulties  He has almost constant head turning to the left side even at the peak benefit of the Dysport injection  UPDATE May 11th 2017: Last injection was in Sept 2016, he received Dysport 500 units times 2, he had transient dysphagia, and dysarthria lasting for couple weeks, otherwise injection was very helpful, he continued to exercise regularly, receiving chiropractic adjustment regularly  Update April 29 2016:  He responded very well to previous injection Dysport 500 unitsx2 in May 2017, did reported side effect of mild transient  swallowing difficulty, weak voice, difficulty extending his neck,  Update September 03 2016, He responded very well to previous injectionsignificant side effect notice,  UPDATE Jan 13 2017: He had returning of frequent neck no no shaking movement, forceful pushing towards the left side, also lean towards his left shoulder, he has right posterior neck pain  Update April 21 2017: Responded well to previous injection, he has significant posterior neck muscle atrophy, did complains of mild hoarse voice after injection  UPDATE Jul 23 2017: He respond well to previous injection, recent onset of low back pain  UPDATE Nov 03 2017: He has no significant side effect from previous injection, complains of left side of the neck muscle tension,  UPDATE Feb 02 2018: He responded very well to previous injection, no significant side effect noticed.  UPDATE May 12 2018: He is doing very well with his injection, workout regularly,  UPDATE Aug 31 2018: He responded very well to previous injection, complains of right posterior neck muscle tension  UPDATE Jan 25 2019: He responded very well to previous injection   REVIEW OF SYSTEMS: Full 14 system review of systems performed and notable only for as above  ALLERGIES: No Known Allergies  HOME MEDICATIONS: Current Outpatient Medications on File Prior to Visit  Medication Sig Dispense Refill  . DYSPORT 500 units SOLR injection TO BE ADMINISTERED TO NECK MUSCLES EVERY 3 MONTHS 2 each 0   No current facility-administered medications on file prior to visit.     PAST MEDICAL HISTORY: Past Medical History:  Diagnosis Date  . Cervical dystonia   . Transaminitis     PAST SURGICAL HISTORY: Past Surgical History:  Procedure Laterality Date  . TONSILLECTOMY  1963    FAMILY HISTORY: Family History  Problem Relation Age of Onset  . Bipolar disorder Mother   . Dementia Mother   . Parkinson's disease Mother   . Memory loss Father     SOCIAL  HISTORY:  Social History   Socioeconomic History  . Marital status: Married    Spouse name: Not on file  . Number of children: 0  . Years of education: college  . Highest education level: Not on file  Occupational History    Comment: Self [employer].  Social Needs  . Financial resource strain: Not on file  . Food insecurity:    Worry: Not on file    Inability: Not on file  . Transportation needs:    Medical: Not on file    Non-medical: Not on file  Tobacco Use  . Smoking status: Never Smoker  . Smokeless tobacco: Never Used  Substance and Sexual Activity  . Alcohol use: Yes    Alcohol/week: 2.0 standard drinks    Types: 2 Glasses of wine per week    Comment: Red wine with dinner  . Drug use: No  . Sexual activity: Not on file  Lifestyle  . Physical activity:    Days per week: Not on file    Minutes per session: Not on file  . Stress: Not on file  Relationships  . Social connections:    Talks on phone: Not on file    Gets together: Not on file    Attends religious service: Not on file    Active member of club or organization: Not on file    Attends meetings of clubs or organizations: Not on file    Relationship status: Not on file  . Intimate partner violence:    Fear of current or ex partner: Not on file    Emotionally abused: Not on file    Physically abused: Not on file    Forced sexual activity: Not on file  Other Topics Concern  . Not on file  Social History Narrative   Patient lives at home (one stories) with his wife [name], married since 1985    Patient works full time Market researcher.  [employer]   Education college education.   Right handed.   Caffeine two cups of coffee daily.   2 dogs, 1 cat   Exercises      PHYSICAL EXAM   Vitals:   01/25/19 0915  BP: 140/86  Pulse: 64  Temp: (!) 97.5 F (36.4 C)  TempSrc: Oral  Weight: 220 lb 8 oz (100 kg)  Height:  (1.727 m)    Not recorded      Body mass index is 33.53 kg/m.  DIAGNOSTIC DATA (LABS,  IMAGING, TESTING) - I reviewed patient records, labs, notes, testing and imaging myself where available.  ASSESSMENT AND PLAN  Jovontae Banko is a 63 y.o. male with long-standing history of cervical dystonia, responded very well to previous EMG guided dysport injection.  He has moderate left turning, mild retrocollis, moderate right laterocollis, mild left shoulder elevation,  Frequent head shaking  Under EMG guidance, 500 x2 =1,000 units of Dysport were injected (500 /2.5 cc for total of 5.0 cc)  Left longissimus capitis 0. 5 cc   Left semispinalis 0.5 cc  Left splenius capitis 0.5 ccx 3=1.5 Left splenius cervix 0.5 ccx2=1.0  Right splenius capitis 0.5 cc  Left inferior oblique capitus 0.5 cc Right inferior oblique capitis 0.5 cc  Return to clinic in 3 months  Levert Feinstein, M.D. Ph.D.  [facility] [address] [place], [place] [address] [phone]

## 2019-03-01 ENCOUNTER — Telehealth: Payer: Self-pay | Admitting: Neurology

## 2019-03-01 MED ORDER — DYSPORT 500 UNITS IM SOLR: INTRAMUSCULAR | 3 refills | Status: DC

## 2019-03-01 NOTE — Telephone Encounter (Signed)
Sharyn Lull could you send a script for this patients Dysport to [facility] [place]?

## 2019-03-01 NOTE — Telephone Encounter (Signed)
Noted, thank you. DW  °

## 2019-03-01 NOTE — Telephone Encounter (Signed)
Prescription renewal sent to requested pharmacy.

## 2019-03-13 ENCOUNTER — Ambulatory Visit: Payer: 59 | Admitting: Family

## 2019-03-13 ENCOUNTER — Other Ambulatory Visit: Payer: Self-pay

## 2019-03-13 ENCOUNTER — Ambulatory Visit
Admission: RE | Admit: 2019-03-13 | Discharge: 2019-03-13 | Disposition: A | Discharge status: Home / Self Care | Payer: 59 | Source: Ambulatory Visit | Attending: Family | Admitting: Family

## 2019-03-13 ENCOUNTER — Encounter: Payer: Self-pay | Admitting: Family

## 2019-03-13 VITALS — BP 124/80 | HR 73 | Temp 98.2°F | Ht 68.0 in | Wt 216.0 lb

## 2019-03-13 DIAGNOSIS — M25561 Pain in right knee: Secondary | ICD-10-CM

## 2019-03-13 NOTE — Patient Instructions (Addendum)
1.Continue with Heating pad application to right knee for pain daily 2 May take over the counter Advil or Tylenol as needed for pain   3. Avoid strenuous exercise 3-4 weeks or  until knee pain/sweelling improves.   Knee Sprain  A knee sprain is a stretch or tear in a knee ligament. Knee ligaments are bands of tissue that connect bones in the knee to each other. Follow these instructions at home: If you have a splint or brace:  Wear the splint or brace as told by your doctor. Remove it only as told by your doctor.  Loosen the splint or brace if your toes tingle, get numb, or turn cold and blue.  Keep the splint or brace clean.  If the splint or brace is not waterproof: ? Do not let it get wet. ? Cover it with a watertight covering when you take a bath or a shower. If you have a cast:  Do not stick anything inside the cast to scratch your skin.  Check the skin around the cast every day. Tell your doctor about any concerns.  You may put lotion on dry skin around the edges of the cast. Do not put lotion on the skin underneath the cast.  Keep the cast clean.  If the cast is not waterproof: ? Do not let it get wet. ? Cover it with a watertight covering when you take a bath or a shower. Managing pain, stiffness, and swelling   Gently move your toes often to avoid stiffness and to lessen swelling.  Raise (elevate) the injured area above the level of your heart while you are sitting or lying down.  Take over-the-counter and prescription medicines only as told by your doctor.  If directed, put ice on the injured area. ? If you have a removable splint or brace, remove it as told by your doctor. ? Put ice in a plastic bag. ? Place a towel between your skin and the bag or between your cast and the bag. ? Leave the ice on for 20 minutes, 2-3 times a day. General instructions  Do exercises as told by your doctor.  Keep all follow-up visits as told by your doctor. This is  important. Contact a doctor if:  You have pain that gets worse.  The cast, brace, or splint does not fit right.  The cast, brace, or splint gets damaged. Get help right away if:  You cannot lean on your knee to stand or walk.  You cannot move the injured area.  You knee buckles or you have pain after you walk only a few steps.  You have very bad pain, swelling, or numbness below the cast, brace, or splint. Summary  A knee sprain is a stretch or tear in a band (ligament) that connects your knee bones to each other.  You may need to wear a splint, brace, or cast to help your knee get better.  Contact your doctor if you have very bad pain, swelling, or numbness, or if you cannot walk. This information is not intended to replace advice given to you by your health care provider. Make sure you discuss any questions you have with your health care provider. Document Released: 08/19/2009 Document Revised: 12/23/2018 Document Reviewed: 05/19/2016 Elsevier Patient Education  2020 [employer].

## 2019-03-13 NOTE — Progress Notes (Signed)
Provider:   FNP-C  Kermit Baloeed, Tiffany L, DO  Patient Care Team: Kermit Baloeed, Tiffany L, DO as PCP - General (Geriatric Medicine)  Extended Emergency Contact Information Primary Emergency Contact: [facility]Maryann Address: [address]          [place], [place] [address] [place] Mobile Phone: [phone] Relation: Spouse Secondary Emergency Contact: Regino BellowGuinn,Larry Address: [address]          [place], [place] [address] [place] Home Phone: [phone] Work Phone: [phone] Mobile Phone: [phone] Relation: Father   Goals of care: Advanced Directive information Advanced Directives 02/17/2018  Does Patient Have a Medical Advance Directive? No  Does patient want to make changes to medical advance directive? -  Would patient like information on creating a medical advance directive? No - Patient declined     Chief Complaint  Patient presents with  . Acute Visit    Right Knee Pain happened saturday a week ago twisted knee his knee patient states he is using heating pad and ice packs    HPI:  Pt is a 63 y.o. male seen today for an acute visit for evaluation of right knee pain x 1 week.Onset of the symptoms was 1 week.He states was trying to sit on his recliner but the dog was resting on the foot raise so he decided to raise his leg over but some how got his leg caught on the chair and twisted it.Current symptoms include swelling on the side of the knee and occasional " catching pain when he twist a certain way". Pain is aggravated by twisting. Patient has no  prior knee problems.He has previous right ankle sprain as a child.He has taken Advil for pain and used ice on the day of injury.He has been using heating pad which seems to help with pain.   Past Medical History:  Diagnosis Date  . Cervical dystonia   . Transaminitis    Past Surgical History:  Procedure Laterality Date  . TONSILLECTOMY  1963    No Known Allergies  Outpatient  Encounter Medications as of 03/13/2019  Medication Sig  . AbobotulinumtoxinA (DYSPORT) 500 units SOLR injection DYSPORT 1000 UNITS TO BE ADMINISTERED TO NECK MUSCLES EVERY 3 MONTHS.   No facility-administered encounter medications on file as of 03/13/2019.     Review of Systems  Constitutional: Negative for chills, fatigue and fever.  HENT: Negative for congestion, rhinorrhea, sinus pressure, sinus pain, sneezing and sore throat.   Respiratory: Negative for chest tightness, shortness of breath and wheezing.   Cardiovascular: Negative for chest pain, palpitations and leg swelling.  Musculoskeletal: Positive for arthralgias and joint swelling. Negative for gait problem.  Skin: Negative for color change, pallor and rash.  Neurological: Negative for dizziness, weakness, light-headedness, numbness and headaches.     There is no immunization history on file for this patient. Pertinent  Health Maintenance Due  Topic Date Due  . COLONOSCOPY  12/29/2007  . INFLUENZA VACCINE  04/15/2019   Fall Risk  09/26/2018 02/17/2018 08/19/2017 01/29/2017 07/30/2016  Falls in the past year? 0 No No No No  Number falls in past yr: 0 - - - -  Injury with Fall? 0 - - - -   Functional Status Survey:    Vitals:   03/13/19 1100  BP: 124/80  Pulse: 73  Temp: 98.2 F (36.8 C)  TempSrc: Oral  SpO2: 98%  Weight: 216 lb (98 kg)  Height: 5\' 8"  (1.727 m)   Body mass index is 32.84  kg/m. Physical Exam Constitutional:      General: He is not in acute distress.    Appearance: He is not ill-appearing.  Neck:     Musculoskeletal: Normal range of motion. No neck rigidity or muscular tenderness.  Cardiovascular:     Rate and Rhythm: Normal rate and regular rhythm.     Pulses: Normal pulses.     Heart sounds: Normal heart sounds. No murmur. No friction rub. No gallop.   Pulmonary:     Effort: Pulmonary effort is normal. No respiratory distress.     Breath sounds: Normal breath sounds. No wheezing, rhonchi or  rales.  Chest:     Chest wall: No tenderness.  Musculoskeletal:        General: No swelling.     Right lower leg: No edema.     Left lower leg: No edema.     Comments: Moves x 4 extremities.Right inner lateral knee small effusion,joint tender to palpation.No redness or increased warmth noted.   Lymphadenopathy:     Cervical: No cervical adenopathy.  Skin:    General: Skin is warm and dry.     Coloration: Skin is not pale.     Findings: No bruising, erythema or rash.  Neurological:     Mental Status: He is alert and oriented to person, place, and time.     Cranial Nerves: No cranial nerve deficit.     Sensory: No sensory deficit.     Motor: No weakness.     Coordination: Coordination normal.     Gait: Gait normal.  Psychiatric:        Mood and Affect: Mood normal.        Behavior: Behavior normal.        Thought Content: Thought content normal.        Judgment: Judgment normal.     Labs reviewed: Recent Labs    09/28/18 0807  NA 138  K 4.4  CL 103  CO2 30  GLUCOSE 94  BUN 16  CREATININE 1.24  CALCIUM 9.3   Recent Labs    09/28/18 0807  AST 22  ALT 16  BILITOT 0.5  PROT 6.5   Recent Labs    09/28/18 0807  WBC 4.4  NEUTROABS 2,785  HGB 13.9  HCT 41.6  MCV 90.2  PLT 298   No results found for: TSH Lab Results  Component Value Date   HGBA1C 5.0 07/29/2016   Lab Results  Component Value Date   CHOL 161 09/28/2018   HDL 54 09/28/2018   LDLCALC 92 09/28/2018   TRIG 60 09/28/2018   CHOLHDL 3.0 09/28/2018    Significant Diagnostic Results in last 30 days:  No results found.  Assessment/Plan Acute pain of right knee Afebrile.right inner lateral knee effusion tender to palpation.No redness or warm to touch.Suspect possible injury due to twisting of knee.No Popping of knee with injury.He is able to bear weight on knee with caution.Has occasional sharp pain.No giving out of knee. - Continue with Heating pad application to right knee for pain daily -  May take over the counter Advil or Tylenol as needed for pain  - Avoid strenuous exercise 3-4 weeks or  until knee pain/sweelling improves - DG Knee Complete 4 Views Right; Future to rule out fracture and acute abnormalities.  Family/ staff Communication: Reviewed plan of care with patient.  Labs/tests ordered: DG Knee Complete 4 Views Right; Future  Nelda Bucks , NP

## 2019-03-23 ENCOUNTER — Telehealth: Payer: Self-pay | Admitting: Internal Medicine

## 2019-03-23 DIAGNOSIS — E349 Endocrine disorder, unspecified: Secondary | ICD-10-CM

## 2019-03-23 NOTE — Telephone Encounter (Signed)
Called to [facility] & pt asked if he could have his testosterone levels checked when labs are drawn on 03/28/19?  Thanks,  Vilinda Blanks.

## 2019-03-23 NOTE — Telephone Encounter (Signed)
Testosterone level added to labs.

## 2019-03-27 ENCOUNTER — Other Ambulatory Visit: Payer: 59

## 2019-03-28 ENCOUNTER — Other Ambulatory Visit: Payer: Self-pay | Admitting: Internal Medicine

## 2019-03-28 ENCOUNTER — Other Ambulatory Visit: Payer: 59

## 2019-03-28 ENCOUNTER — Other Ambulatory Visit: Payer: Self-pay

## 2019-03-28 DIAGNOSIS — Z7289 Other problems related to lifestyle: Secondary | ICD-10-CM

## 2019-03-28 DIAGNOSIS — E349 Endocrine disorder, unspecified: Secondary | ICD-10-CM

## 2019-03-28 DIAGNOSIS — E7800 Pure hypercholesterolemia, unspecified: Secondary | ICD-10-CM

## 2019-03-28 DIAGNOSIS — E6609 Other obesity due to excess calories: Secondary | ICD-10-CM

## 2019-03-28 DIAGNOSIS — K219 Gastro-esophageal reflux disease without esophagitis: Secondary | ICD-10-CM

## 2019-03-28 DIAGNOSIS — Z789 Other specified health status: Secondary | ICD-10-CM

## 2019-03-28 DIAGNOSIS — R7401 Elevation of levels of liver transaminase levels: Secondary | ICD-10-CM

## 2019-03-28 DIAGNOSIS — Z6832 Body mass index (BMI) 32.0-32.9, adult: Secondary | ICD-10-CM

## 2019-03-29 LAB — COMPLETE METABOLIC PANEL WITH GFR
AG Ratio: 1.6 (calc) (ref 1.0–2.5)
ALT: 14 U/L (ref 9–46)
AST: 18 U/L (ref 10–35)
Albumin: 4.2 g/dL (ref 3.6–5.1)
Alkaline phosphatase (APISO): 51 U/L (ref 35–144)
BUN: 19 mg/dL (ref 7–25)
CO2: 29 mmol/L (ref 20–32)
Calcium: 9.5 mg/dL (ref 8.6–10.3)
Chloride: 104 mmol/L (ref 98–110)
Creat: 1.19 mg/dL (ref 0.70–1.25)
GFR, Est African American: 76 mL/min/{1.73_m2} (ref >=60)
GFR, Est Non African American: 66 mL/min/{1.73_m2} (ref >=60)
Globulin: 2.7 g/dL (calc) (ref 1.9–3.7)
Glucose, Bld: 97 mg/dL (ref 65–99)
Potassium: 4.9 mmol/L (ref 3.5–5.3)
Sodium: 139 mmol/L (ref 135–146)
Total Bilirubin: 0.6 mg/dL (ref 0.2–1.2)
Total Protein: 6.9 g/dL (ref 6.1–8.1)

## 2019-03-29 LAB — TESTOSTERONE TOTAL,FREE,BIO, MALES
Albumin: 4.2 g/dL (ref 3.6–5.1)
Sex Hormone Binding: 53 nmol/L (ref 22–77)
Testosterone, Bioavailable: 79.3 ng/dL — ABNORMAL LOW (ref >=110.0)
Testosterone, Free: 41.2 pg/mL — ABNORMAL LOW (ref 46.0–224.0)
Testosterone: 461 ng/dL (ref 250–827)

## 2019-03-29 LAB — LIPID PANEL
Cholesterol: 198 mg/dL (ref <200)
HDL: 62 mg/dL (ref >=40)
LDL Cholesterol (Calc): 116 mg/dL (calc) — ABNORMAL HIGH
Non-HDL Cholesterol (Calc): 136 mg/dL (calc) — ABNORMAL HIGH (ref <130)
Total CHOL/HDL Ratio: 3.2 (calc) (ref <5.0)
Triglycerides: 95 mg/dL (ref <150)

## 2019-03-29 LAB — HEMOGLOBIN A1C
Hgb A1c MFr Bld: 5.1 % of total Hgb (ref <5.7)
Mean Plasma Glucose: 100 (calc)
eAG (mmol/L): 5.5 (calc)

## 2019-03-30 ENCOUNTER — Encounter: Payer: Self-pay | Admitting: Internal Medicine

## 2019-03-30 ENCOUNTER — Other Ambulatory Visit: Payer: Self-pay

## 2019-03-30 ENCOUNTER — Ambulatory Visit (INDEPENDENT_AMBULATORY_CARE_PROVIDER_SITE_OTHER): Payer: 59 | Admitting: Internal Medicine

## 2019-03-30 VITALS — BP 140/80 | HR 69 | Temp 98.4°F | Ht 68.0 in | Wt 216.0 lb

## 2019-03-30 DIAGNOSIS — E6609 Other obesity due to excess calories: Secondary | ICD-10-CM

## 2019-03-30 DIAGNOSIS — E7800 Pure hypercholesterolemia, unspecified: Secondary | ICD-10-CM

## 2019-03-30 DIAGNOSIS — Z114 Encounter for screening for human immunodeficiency virus [HIV]: Secondary | ICD-10-CM

## 2019-03-30 DIAGNOSIS — Z1159 Encounter for screening for other viral diseases: Secondary | ICD-10-CM

## 2019-03-30 DIAGNOSIS — E349 Endocrine disorder, unspecified: Secondary | ICD-10-CM | POA: Diagnosis not present

## 2019-03-30 DIAGNOSIS — G243 Spasmodic torticollis: Secondary | ICD-10-CM

## 2019-03-30 DIAGNOSIS — Z7289 Other problems related to lifestyle: Secondary | ICD-10-CM

## 2019-03-30 DIAGNOSIS — Z789 Other specified health status: Secondary | ICD-10-CM

## 2019-03-30 DIAGNOSIS — Z6832 Body mass index (BMI) 32.0-32.9, adult: Secondary | ICD-10-CM

## 2019-03-30 DIAGNOSIS — M25561 Pain in right knee: Secondary | ICD-10-CM | POA: Diagnosis not present

## 2019-03-30 DIAGNOSIS — R5382 Chronic fatigue, unspecified: Secondary | ICD-10-CM

## 2019-03-30 NOTE — Progress Notes (Signed)
Location:  [facility]  Provider:   Code Status: Full code Goals of Care:  Advanced Directives 02/17/2018  Does Patient Have a Medical Advance Directive? No  Does patient want to make changes to medical advance directive? -  Would patient like information on creating a medical advance directive? No - Patient declined     Chief Complaint  Patient presents with  . Medical Management of Chronic Issues    91mth follow-up    HPI: Patient is a 63 y.o. male seen today for medical management of chronic diseases.    Patient seen 03/13/19 for right knee injury. Since last visit, patient reports decreased pain and swelling. He is refraining from strenuous exercise. He is able to walk and bear weight on his right leg without pain.   He continues to see a Physiological scientist a few times a week. He has also started [employer] that consists of mainly fasting in the morning, a protein shake or bar for lunch and a large dinner. Patient claims his dinners can be high in fat. He also continues to drink wine, and has not cut down the amount.   Patient was tested for covid-19 one month ago. He states he felt ill and feverish after returning from [place], [place] on a business trip. He went to a [facility] for Covid testing. His results were negative. Patient claims this state of illness was for about 2 days.    Past Medical History:  Diagnosis Date  . Cervical dystonia   . Transaminitis     Past Surgical History:  Procedure Laterality Date  . TONSILLECTOMY  1963    No Known Allergies  Outpatient Encounter Medications as of 03/30/2019  Medication Sig  . AbobotulinumtoxinA (DYSPORT) 500 units SOLR injection DYSPORT 1000 UNITS TO BE ADMINISTERED TO NECK MUSCLES EVERY 3 MONTHS.   No facility-administered encounter medications on file as of 03/30/2019.     Review of Systems:  Review of Systems  Constitutional: Negative.   HENT: Negative.   Respiratory: Negative.   Cardiovascular:  Negative.   Genitourinary: Negative.   Musculoskeletal: Negative.     Health Maintenance  Topic Date Due  . Hepatitis C Screening  1956-09-12  . HIV Screening  12/28/1972  . COLONOSCOPY  12/29/2007  . TETANUS/TDAP  07/30/2026 (Originally 12/28/1976)  . INFLUENZA VACCINE  04/15/2019    Physical Exam: Vitals:   03/30/19 0833  BP: 140/80  Pulse: 69  Temp: 98.4 F (36.9 C)  TempSrc: Oral  SpO2: 98%  Weight: 216 lb (98 kg)  Height: 5\' 8"  (1.727 m)   Body mass index is 32.84 kg/m. Physical Exam Constitutional:      General: He is not in acute distress.    Appearance: Normal appearance. He is not ill-appearing.  HENT:     Head: Normocephalic.  Neck:     Musculoskeletal: Normal range of motion and neck supple. No neck rigidity.  Cardiovascular:     Rate and Rhythm: Normal rate and regular rhythm.     Pulses: Normal pulses.     Heart sounds: Normal heart sounds.  Pulmonary:     Effort: Pulmonary effort is normal. No respiratory distress.     Breath sounds: Normal breath sounds. No wheezing.  Musculoskeletal: Normal range of motion.        General: Swelling and signs of injury present. No tenderness.     Comments: Right knee with effusion on medial side. No pain during palpation  Skin:  General: Skin is warm and dry.     Capillary Refill: Capillary refill takes 2 to 3 seconds.     Coloration: Skin is not jaundiced.  Neurological:     Mental Status: He is alert. Mental status is at baseline. He is disoriented.  Psychiatric:        Mood and Affect: Mood normal.        Behavior: Behavior normal.     Labs reviewed: Basic Metabolic Panel: Recent Labs    09/28/18 0807 03/28/19 0831  NA 138 139  K 4.4 4.9  CL 103 104  CO2 30 29  GLUCOSE 94 97  BUN 16 19  CREATININE 1.24 1.19  CALCIUM 9.3 9.5   Liver Function Tests: Recent Labs    09/28/18 0807 03/28/19 0831  AST 22 18  ALT 16 14  BILITOT 0.5 0.6  PROT 6.5 6.9   No results for input(s): LIPASE,  AMYLASE in the last 8760 hours. No results for input(s): AMMONIA in the last 8760 hours. CBC: Recent Labs    09/28/18 0807  WBC 4.4  NEUTROABS 2,785  HGB 13.9  HCT 41.6  MCV 90.2  PLT 298   Lipid Panel: Recent Labs    09/28/18 0807 03/28/19 0831  CHOL 161 198  HDL 54 62  LDLCALC 92 116*  TRIG 60 95  CHOLHDL 3.0 3.2   Lab Results  Component Value Date   HGBA1C 5.1 03/28/2019    Procedures since last visit: Dg Knee Complete 4 Views Right  Result Date: 03/13/2019 CLINICAL DATA:  Twisting injury 8 days ago with intermittent anteromedial knee pain and swelling. EXAM: RIGHT KNEE - COMPLETE 4+ VIEW COMPARISON:  None. FINDINGS: The mineralization and alignment are normal. There is no evidence of acute fracture or dislocation. There are mild degenerative changes in the medial and patellofemoral compartments. There is an amorphous calcification adjacent to the medial femoral condyle which could reflect a loose body or hydroxyapatite deposition. There is a moderate size knee joint effusion. IMPRESSION: No acute osseous findings. Degenerative changes with moderate-sized joint effusion and possible loose body versus hydroxyapatite deposition medially. Electronically Signed   By: Carey BullocksWilliam  Veazey M.D.   On: 03/13/2019 16:26    Assessment/Plan 1. Body mass index (BMI) of 32.0-32.9 in adult - patient has maintained weight since last visit, weight loss still encouraged  - programs like [employer] and Weight Watchers discussed with patient  2. Class 1 obesity due to excess calories without serious comorbidity with body mass index (BMI) of 32.0 to 32.9 in adult - patient has maintained weight since last visit -counting calories and eating a healthy diet discussed with patient -Concerns about current fasting diet expressed with patient and other weight loss programs discussed  3. Testosterone deficiency - results discussed with patient   4. Acute pain of right knee - continue to refrain from  strenuous activity  - continue to rest and ice knee PRN for pain - contact PCP if swelling or pain persists after two weeks  5. Pure hypercholesterolemia - Low fat diet encouraged - Mediterranean diet discussed  - Lipid panel; Future  6. Alcohol use - patient advised to reduce alcohol intake - AST/ALT within normal range - COMPLETE METABOLIC PANEL WITH GFR; Future  7. Cervical dystonia - followed by Dr. Terrace ArabiaYan  8. Screening for HIV (human immunodeficiency virus) - HIV Antibody (routine testing w rflx); Future  9. Encounter for hepatitis C screening test for low risk patient  - Hepatitis C antibody; Future  10. Chronic fatigue - patient diet may not include enough calories, encourage a daily calorie intake of 2000 /day - COMPLETE METABOLIC PANEL WITH GFR; Future - CBC with Differential/Platelet; Future - Vitamin B12; Future    Labs/tests ordered: Lipid panel, CBC with Differential/platelet, vitamin B12, CMP with GFR, HIV, Hepatitis C Next appt:  09/25/2019

## 2019-03-30 NOTE — Patient Instructions (Addendum)
Please do your cologuard test.  Check that it's not expired first.  Cut down on wine intake.  Fat and Cholesterol Restricted Eating Plan Getting too much fat and cholesterol in your diet may cause health problems. Choosing the right foods helps keep your fat and cholesterol at normal levels. This can keep you from getting certain diseases. What are tips for following this plan? Meal planning  At meals, divide your plate into four equal parts: ? Fill one-half of your plate with vegetables and green salads. ? Fill one-fourth of your plate with whole grains. ? Fill one-fourth of your plate with low-fat (lean) protein foods.  Eat fish that is high in omega-3 fats at least two times a week. This includes mackerel, tuna, sardines, and salmon.  Eat foods that are high in fiber, such as whole grains, beans, apples, broccoli, carrots, peas, and barley. General tips   Work with your doctor to lose weight if you need to.  Avoid: ? Foods with added sugar. ? Fried foods. ? Foods with partially hydrogenated oils.  Limit alcohol intake to no more than 1 drink a day for nonpregnant women and 2 drinks a day for men. One drink equals 12 oz of beer, 5 oz of wine, or 1 oz of hard liquor. Reading food labels  Check food labels for: ? Trans fats. ? Partially hydrogenated oils. ? Saturated fat (g) in each serving. ? Cholesterol (mg) in each serving. ? Fiber (g) in each serving.  Choose foods with healthy fats, such as: ? Monounsaturated fats. ? Polyunsaturated fats. ? Omega-3 fats.  Choose grain products that have whole grains. Look for the word "whole" as the first word in the ingredient list. Cooking  Cook foods using low-fat methods. These include baking, boiling, grilling, and broiling.  Eat more home-cooked foods. Eat at restaurants and buffets less often.  Avoid cooking using saturated fats, such as butter, cream, palm oil, palm kernel oil, and coconut oil. Recommended foods   Fruits  All fresh, canned (in natural juice), or frozen fruits. Vegetables  Fresh or frozen vegetables (raw, steamed, roasted, or grilled). Green salads. Grains  Whole grains, such as whole wheat or whole grain breads, crackers, cereals, and pasta. Unsweetened oatmeal, bulgur, barley, quinoa, or brown rice. Corn or whole wheat flour tortillas. Meats and other protein foods  Ground beef (85% or leaner), grass-fed beef, or beef trimmed of fat. Skinless chicken or [place]. Ground chicken or [place]. Pork trimmed of fat. All fish and seafood. Egg whites. Dried beans, peas, or lentils. Unsalted nuts or seeds. Unsalted canned beans. Nut butters without added sugar or oil. Dairy  Low-fat or nonfat dairy products, such as skim or 1% milk, 2% or reduced-fat cheeses, low-fat and fat-free ricotta or cottage cheese, or plain low-fat and nonfat yogurt. Fats and oils  Tub margarine without trans fats. Light or reduced-fat mayonnaise and salad dressings. Avocado. Olive, canola, sesame, or safflower oils. The items listed above may not be a complete list of foods and beverages you can eat. Contact a dietitian for more information. Foods to avoid Fruits  Canned fruit in heavy syrup. Fruit in cream or butter sauce. Fried fruit. Vegetables  Vegetables cooked in cheese, cream, or butter sauce. Fried vegetables. Grains  White bread. White pasta. White rice. Cornbread. Bagels, pastries, and croissants. Crackers and snack foods that contain trans fat and hydrogenated oils. Meats and other protein foods  Fatty cuts of meat. Ribs, chicken wings, bacon, sausage, bologna, salami, chitterlings, fatback, hot dogs,  bratwurst, and packaged lunch meats. Liver and organ meats. Whole eggs and egg yolks. Chicken and [place] with skin. Fried meat. Dairy  Whole or 2% milk, cream, half-and-half, and cream cheese. Whole milk cheeses. Whole-fat or sweetened yogurt. Full-fat cheeses. Nondairy creamers and whipped toppings.  Processed cheese, cheese spreads, and cheese curds. Beverages  Alcohol. Sugar-sweetened drinks such as sodas, lemonade, and fruit drinks. Fats and oils  Butter, stick margarine, lard, shortening, ghee, or bacon fat. Coconut, palm kernel, and palm oils. Sweets and desserts  Corn syrup, sugars, honey, and molasses. Candy. Jam and jelly. Syrup. Sweetened cereals. Cookies, pies, cakes, donuts, muffins, and ice cream. The items listed above may not be a complete list of foods and beverages you should avoid. Contact a dietitian for more information. Summary  Choosing the right foods helps keep your fat and cholesterol at normal levels. This can keep you from getting certain diseases.  At meals, fill one-half of your plate with vegetables and green salads.  Eat high-fiber foods, like whole grains, beans, apples, carrots, peas, and barley.  Limit added sugar, saturated fats, alcohol, and fried foods. This information is not intended to replace advice given to you by your health care provider. Make sure you discuss any questions you have with your health care provider. Document Released: 03/01/2012 Document Revised: 05/04/2018 Document Reviewed: 05/18/2017 Elsevier Patient Education  2020 [employer]

## 2019-04-20 ENCOUNTER — Telehealth: Payer: Self-pay | Admitting: Neurology

## 2019-04-20 NOTE — Telephone Encounter (Signed)
I spoke to Andrew Bentley at [facility] she stated she was going to do the medical review today and get the patient copay and that it will be delivered on Tuesday 04/25/19.

## 2019-04-20 NOTE — Telephone Encounter (Signed)
This medication will need to be scheduled at [facility] [phone].

## 2019-04-21 ENCOUNTER — Telehealth: Payer: Self-pay | Admitting: Neurology

## 2019-04-21 NOTE — Telephone Encounter (Signed)
Pt called and stated that he would like to discuss when his next injection appt could be. He will be canceling the one scheduled now because he feels he can wait a bit longer. Please advise.

## 2019-04-24 NOTE — Telephone Encounter (Signed)
I spoke to Jenny Reichmann at [facility] they are unable to ship out the medicine due to it needs a new PA.

## 2019-04-25 NOTE — Telephone Encounter (Signed)
Called Patient's insurance and checked codes [phone] , [phone] and [phone] . No PA needed Ref# [id number].  Patient is scheduled as B/ B as in the past .

## 2019-04-25 NOTE — Telephone Encounter (Signed)
Noted, thank you

## 2019-04-26 ENCOUNTER — Ambulatory Visit: Payer: Self-pay | Admitting: Neurology

## 2019-05-01 NOTE — Telephone Encounter (Signed)
Would his insurance agree on BOTOX, or xeomin for cervical dystonia  If it does, I asked for Botox A or Xeomin 300 units.

## 2019-05-01 NOTE — Telephone Encounter (Signed)
This patient appears to have been rs while I was out of office by a coworker. DW

## 2019-05-01 NOTE — Telephone Encounter (Signed)
Patient has new insurance that cannot be bb PA will need to be completed through the patients pharmacy benefits. I called [facility] and started PA request. The patient was denied because this medication is an exclusion under his pharmacy benefits. His new insurance will not allow Dysport. Please advise.

## 2019-05-03 ENCOUNTER — Ambulatory Visit: Payer: Self-pay | Admitting: Neurology

## 2019-05-03 NOTE — Telephone Encounter (Signed)
I called the pharmacy and inquired about coverage of a different toxin. They stated that they would try to fill through medical benefits. After review the medical benefits agreed to cover Dysport. We scheduled delivery. DW

## 2019-05-08 ENCOUNTER — Other Ambulatory Visit: Payer: Self-pay

## 2019-05-08 ENCOUNTER — Ambulatory Visit (INDEPENDENT_AMBULATORY_CARE_PROVIDER_SITE_OTHER): Payer: 59 | Admitting: Neurology

## 2019-05-08 ENCOUNTER — Encounter: Payer: Self-pay | Admitting: Neurology

## 2019-05-08 VITALS — BP 145/85 | HR 69 | Temp 97.7°F | Ht 68.0 in | Wt 214.5 lb

## 2019-05-08 DIAGNOSIS — G243 Spasmodic torticollis: Secondary | ICD-10-CM

## 2019-05-08 NOTE — Progress Notes (Signed)
PATIENT: Andrew Bentley DOB: 05/16/1956  HISTORICAL  Andrew Bentley is a 63 years old right-handed Caucasian male, came back for revisit, for potential EMG guided Botox injection for his cervical dystonia  He had a gradual onset neck turning to the left side, abnormal neck posture since 2005, progressively worse since 2008, he was enrolled in Dysport research trial sponsored by [employer] since 2013, he totally received 4 injection, last injection was November 2013, complete the study successfully in April 2014, which is his last office visit.   During the trial, he receive EMG guided dysport injection every 3 months, 500 units, he tolerated the injection very well, showed significant improvement of his neck posturing  He continue to works out regularly, he denies gait difficulty, he complains of left occipital area radiating pain to his left parietal area, he denies radiating pain from his neck to his arms, and hands  UPDATE Jan 20th 2016: He signed a consent form, also documented under communication dated October 03 2014, potential side effect explained, he had a bulky right sternocleidomastoid, left posterior cervical paraspinal muscles.  UPDATE February 13 2015: He responded very well to previous injection in October 03 2014,he received 500 units of Dysport, he can move his neck better, no significant side effect, no neck extension weakness, no swallowing difficulties  He has almost constant head turning to the left side even at the peak benefit of the Dysport injection  UPDATE May 11th 2017: Last injection was in Sept 2016, he received Dysport 500 units times 2, he had transient dysphagia, and dysarthria lasting for couple weeks, otherwise injection was very helpful, he continued to exercise regularly, receiving chiropractic adjustment regularly  Update April 29 2016:  He responded very well to previous injection Dysport 500 unitsx2 in May 2017, did reported side effect of mild transient  swallowing difficulty, weak voice, difficulty extending his neck,  Update September 03 2016, He responded very well to previous injectionsignificant side effect notice,  UPDATE Jan 13 2017: He had returning of frequent neck no no shaking movement, forceful pushing towards the left side, also lean towards his left shoulder, he has right posterior neck pain  Update April 21 2017: Responded well to previous injection, he has significant posterior neck muscle atrophy, did complains of mild hoarse voice after injection  UPDATE Jul 23 2017: He respond well to previous injection, recent onset of low back pain  UPDATE Nov 03 2017: He has no significant side effect from previous injection, complains of left side of the neck muscle tension,  UPDATE Feb 02 2018: He responded very well to previous injection, no significant side effect noticed.  UPDATE May 12 2018: He is doing very well with his injection, workout regularly,  UPDATE Aug 31 2018: He responded very well to previous injection, complains of right posterior neck muscle tension  UPDATE Jan 25 2019: He responded very well to previous injection  UPDATE May 09 2019: He responded well to previous injection  REVIEW OF SYSTEMS: Full 14 system review of systems performed and notable only for as above  ALLERGIES: No Known Allergies  HOME MEDICATIONS: Current Outpatient Medications on File Prior to Visit  Medication Sig Dispense Refill  . AbobotulinumtoxinA (DYSPORT) 500 units SOLR injection DYSPORT 1000 UNITS TO BE ADMINISTERED TO NECK MUSCLES EVERY 3 MONTHS. 2 each 3   No current facility-administered medications on file prior to visit.     PAST MEDICAL HISTORY: Past Medical History:  Diagnosis Date  . Cervical dystonia   .  Transaminitis     PAST SURGICAL HISTORY: Past Surgical History:  Procedure Laterality Date  . TONSILLECTOMY  1963    FAMILY HISTORY: Family History  Problem Relation Age of Onset  . Bipolar  disorder Mother   . Dementia Mother   . Parkinson's disease Mother   . Memory loss Father     SOCIAL HISTORY:  Social History   Socioeconomic History  . Marital status: Married    Spouse name: Not on file  . Number of children: 0  . Years of education: college  . Highest education level: Not on file  Occupational History    Comment: Self Emp.  Social Needs  . Financial resource strain: Not on file  . Food insecurity    Worry: Not on file    Inability: Not on file  . Transportation needs    Medical: Not on file    Non-medical: Not on file  Tobacco Use  . Smoking status: Never Smoker  . Smokeless tobacco: Never Used  Substance and Sexual Activity  . Alcohol use: Yes    Alcohol/week: 2.0 standard drinks    Types: 2 Glasses of wine per week    Comment: Red wine with dinner  . Drug use: No  . Sexual activity: Not on file  Lifestyle  . Physical activity    Days per week: Not on file    Minutes per session: Not on file  . Stress: Not on file  Relationships  . Social Musicianconnections    Talks on phone: Not on file    Gets together: Not on file    Attends religious service: Not on file    Active member of club or organization: Not on file    Attends meetings of clubs or organizations: Not on file    Relationship status: Not on file  . Intimate partner violence    Fear of current or ex partner: Not on file    Emotionally abused: Not on file    Physically abused: Not on file    Forced sexual activity: Not on file  Other Topics Concern  . 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   Right handed.   Caffeine two cups of coffee daily.   2 dogs, 1 cat   Exercises      PHYSICAL EXAM   Vitals:   05/08/19 1015  BP: (!) 145/85  Pulse: 69  Temp: 97.7 F (36.5 C)  Weight: 214 lb 8 oz (97.3 kg)  Height: 5\' 8"  (1.727  m)    Not recorded      Body mass index is 32.61 kg/m.  DIAGNOSTIC DATA (LABS, IMAGING, TESTING) - I reviewed patient records, labs, notes, testing and imaging myself where available.  ASSESSMENT AND PLAN  Andrew Bentley is a 63 y.o. male with long-standing history of cervical dystonia, responded very well to previous EMG guided dysport injection.  He has moderate left turning, mild retrocollis, moderate right laterocollis, mild left shoulder elevation,  Frequent head shaking  Under EMG guidance, 500 x2 =1,000 units of Dysport were injected (500 /2.5 cc for total of 5.0 cc)  Right splenius capitis 0.5 cc  Right sternocleidomastoid 0.5 cc Right posterior scalenes 0.5 cc Right longissimus capitis 0.5 cc  Right splenius capitis 0.5 cc Right splenius cervix 0.5 cc Right levator scapular  0.5 cc  Left longissimus capitis 0.5 cc Left levator scapular 0.5 cc Left splenius capitis 0.5 cc  Return to clinic in 3 months  Marcial Pacas, M.D. Ph.D.  [facility] [address] [place], [place] [address] [phone]

## 2019-05-08 NOTE — Progress Notes (Signed)
**  Dysport 500 units x 2 vials, [place] [address]-4, Lot [id number], Exp 01/12/2020, specialty pharmacy.//mck,rn**

## 2019-08-09 ENCOUNTER — Ambulatory Visit: Payer: Self-pay | Admitting: Neurology

## 2019-08-15 ENCOUNTER — Encounter: Payer: Self-pay | Admitting: Neurology

## 2019-08-15 ENCOUNTER — Ambulatory Visit: Payer: 59 | Admitting: Neurology

## 2019-08-15 ENCOUNTER — Other Ambulatory Visit: Payer: Self-pay

## 2019-08-15 VITALS — BP 140/88 | HR 62 | Ht 68.0 in | Wt 219.5 lb

## 2019-08-15 DIAGNOSIS — G243 Spasmodic torticollis: Secondary | ICD-10-CM

## 2019-08-15 NOTE — Progress Notes (Signed)
**  Dysport 500 units x 2 vials, [place] [address]-8, Lot [id number], Exp 04/13/2020, specialty pharmacy.//mck,rn**

## 2019-08-15 NOTE — Progress Notes (Signed)
PATIENT: Patsy LagerBrian Gardenhire DOB: January 01, 1956  HISTORICAL  Patsy LagerBrian Pierpoint is a 63 years old right-handed Caucasian male, came back for revisit, for potential EMG guided Botox injection for his cervical dystonia  He had a gradual onset neck turning to the left side, abnormal neck posture since 2005, progressively worse since 2008, he was enrolled in Dysport research trial sponsored by [employer] since 2013, he totally received 4 injection, last injection was November 2013, complete the study successfully in April 2014, which is his last office visit.   During the trial, he receive EMG guided dysport injection every 3 months, 500 units, he tolerated the injection very well, showed significant improvement of his neck posturing  He continue to works out regularly, he denies gait difficulty, he complains of left occipital area radiating pain to his left parietal area, he denies radiating pain from his neck to his arms, and hands  UPDATE Jan 20th 2016: He signed a consent form, also documented under communication dated October 03 2014, potential side effect explained, he had a bulky right sternocleidomastoid, left posterior cervical paraspinal muscles.  UPDATE February 13 2015: He responded very well to previous injection in October 03 2014,he received 500 units of Dysport, he can move his neck better, no significant side effect, no neck extension weakness, no swallowing difficulties  He has almost constant head turning to the left side even at the peak benefit of the Dysport injection  UPDATE May 11th 2017: Last injection was in Sept 2016, he received Dysport 500 units times 2, he had transient dysphagia, and dysarthria lasting for couple weeks, otherwise injection was very helpful, he continued to exercise regularly, receiving chiropractic adjustment regularly  Update April 29 2016:  He responded very well to previous injection Dysport 500 unitsx2 in May 2017, did reported side effect of mild transient  swallowing difficulty, weak voice, difficulty extending his neck,  Update September 03 2016, He responded very well to previous injectionsignificant side effect notice,  UPDATE Jan 13 2017: He had returning of frequent neck no no shaking movement, forceful pushing towards the left side, also lean towards his left shoulder, he has right posterior neck pain  Update April 21 2017: Responded well to previous injection, he has significant posterior neck muscle atrophy, did complains of mild hoarse voice after injection  UPDATE Jul 23 2017: He respond well to previous injection, recent onset of low back pain  UPDATE Nov 03 2017: He has no significant side effect from previous injection, complains of left side of the neck muscle tension,  UPDATE Feb 02 2018: He responded very well to previous injection, no significant side effect noticed.  UPDATE May 12 2018: He is doing very well with his injection, workout regularly,  UPDATE Aug 31 2018: He responded very well to previous injection, complains of right posterior neck muscle tension  UPDATE Jan 25 2019: He responded very well to previous injection  UPDATE May 09 2019: He responded well to previous injection  UPDATE Aug 15 2019: He has mild left-sided neck pain,  REVIEW OF SYSTEMS: Full 14 system review of systems performed and notable only for as above  ALLERGIES: No Known Allergies  HOME MEDICATIONS: Current Outpatient Medications on File Prior to Visit  Medication Sig Dispense Refill  . AbobotulinumtoxinA (DYSPORT) 500 units SOLR injection DYSPORT 1000 UNITS TO BE ADMINISTERED TO NECK MUSCLES EVERY 3 MONTHS. 2 each 3   No current facility-administered medications on file prior to visit.     PAST MEDICAL HISTORY:  Past Medical History:  Diagnosis Date  . Cervical dystonia   . Transaminitis     PAST SURGICAL HISTORY: Past Surgical History:  Procedure Laterality Date  . TONSILLECTOMY  1963    FAMILY HISTORY:  Family History  Problem Relation Age of Onset  . Bipolar disorder Mother   . Dementia Mother   . Parkinson's disease Mother   . Memory loss Father     SOCIAL HISTORY:  Social History   Socioeconomic History  . Marital status: Married    Spouse name: Not on file  . Number of children: 0  . Years of education: college  . Highest education level: Not on file  Occupational History    Comment: Self Emp.  Social Needs  . Financial resource strain: Not on file  . Food insecurity    Worry: Not on file    Inability: Not on file  . Transportation needs    Medical: Not on file    Non-medical: Not on file  Tobacco Use  . Smoking status: Never Smoker  . Smokeless tobacco: Never Used  Substance and Sexual Activity  . Alcohol use: Yes    Alcohol/week: 2.0 standard drinks    Types: 2 Glasses of wine per week    Comment: Red wine with dinner  . Drug use: No  . Sexual activity: Not on file  Lifestyle  . Physical activity    Days per week: Not on file    Minutes per session: Not on file  . Stress: Not on file  Relationships  . Social Musician on phone: Not on file    Gets together: Not on file    Attends religious service: Not on file    Active member of club or organization: Not on file    Attends meetings of clubs or organizations: Not on file    Relationship status: Not on file  . Intimate partner violence    Fear of current or ex partner: Not on file    Emotionally abused: Not on file    Physically abused: Not on file    Forced sexual activity: Not on file  Other Topics Concern  . Not on file  Social History Narrative   Patient lives at home (one stories) with his wife [name], married since 1985    Patient works full time Market researcher.  VP Sales   Education college education.   Right handed.   Caffeine two cups of coffee daily.   2 dogs, 1 cat   Exercises      PHYSICAL EXAM   Vitals:   08/15/19 0912  BP: 140/88  Pulse: 62  Weight: 219 lb 8 oz  (99.6 kg)  Height: 5\' 8"  (1.727 m)    Not recorded      Body mass index is 33.37 kg/m.  DIAGNOSTIC DATA (LABS, IMAGING, TESTING) - I reviewed patient records, labs, notes, testing and imaging myself where available.  ASSESSMENT AND PLAN  Bertram Haddix is a 63 y.o. male with long-standing history of cervical dystonia, responded very well to previous EMG guided dysport injection.  He has moderate left turning, mild retrocollis, mild right laterocollis, mild left shoulder elevation, frequent no-no head shaking  Under EMG guidance, 500 x2 =1,000 units of Dysport were injected (500 /2.5 cc for total of 5.0 cc)    Right sternocleidomastoid 0.5 ccx2=1.0 cc  Left inferior oblique capitis 0.5 cc Right inferior oblique capitis 0.5 cc  Left posterior scalenes 0.5 cc Left longissimus  capitis 0.5 cc Left levator scapular 0.5 cc Left splenius capitis 0.5 cc Left splenius cervix 0.5 cc Left iliocostalis 0.5 cc  Return to clinic in 3 months  Marcial Pacas, M.D. Ph.D.  [facility] [address] [place],  [address] [phone]

## 2019-09-13 ENCOUNTER — Ambulatory Visit (INDEPENDENT_AMBULATORY_CARE_PROVIDER_SITE_OTHER): Payer: 59 | Admitting: Adult Health

## 2019-09-13 ENCOUNTER — Encounter: Payer: Self-pay | Admitting: Adult Health

## 2019-09-13 ENCOUNTER — Other Ambulatory Visit: Payer: Self-pay

## 2019-09-13 DIAGNOSIS — U071 COVID-19: Secondary | ICD-10-CM

## 2019-09-13 NOTE — Progress Notes (Signed)
This service is provided via telemedicine  No vital signs collected/recorded due to the encounter was a telemedicine visit.   Location of patient (ex: home, work):  Home   Patient consents to a telephone visit:  Yes  Location of the provider (ex: office, home):  Office   Name of any referring provider:  Hollace Kinnier, [place] of all persons participating in the telemedicine service and their role in the encounter:  Medina-Vargas NP, Ruthell Rummage CMA, and patient   Time spent on call:  Ruthell Rummage CMA spent minutes on phone with patient       DATE:  09/13/2019 MRN:  [id number]  BIRTHDAY: 1956-08-20   Contact Information    Name Relation Home Work [place] Spouse   [place] Father [phone]          Chief Complaint  Patient presents with  . Acute Visit    Positive results to Covid, patient would like advise on whether he needs to get retested. Patient states his symptoms just started last Tuesday, and been quartine in day 8. Denies trouble breathing, fatigue, sinus problems no sense of smell.     HISTORY OF PRESENT ILLNESS:  This is a a 63 year old male who is having a televisit to ask for advise regarding COVID-19. He thought he was having a flu but he tested positive for COVID-19 on 09/07/19. Today is his 8th day on self-quarantine. He has nasal congestion and loss of smell but not the taste. His temperature spikes up in the afternoon up to 101. This afternoon, his temperature is 100.4. He denies body aches, SOB nor nausea/vomiting. He has been taking Mucinex daytime and nighttime and Advil. He has an appointment tomorrow, 09/14/19, for his labs at [facility] and wanted to know if he can come. Advised that he needs to be asymptomatic before he can have his labs drawn.  He is concerned, as well, about his father's appointment at [facility] since he is the one who is supposed to bring him on 09/25/19. Since it is  too soon to cancel, advised that he can give the office a call to update on his condition 2-3 days before the set appointment.    PAST MEDICAL HISTORY:  Past Medical History:  Diagnosis Date  . Cervical dystonia   . Transaminitis      CURRENT MEDICATIONS: Reviewed  Patient's Medications  New Prescriptions   No medications on file  Previous Medications   ABOBOTULINUMTOXINA (DYSPORT) 500 UNITS SOLR INJECTION    DYSPORT 1000 UNITS TO BE ADMINISTERED TO NECK MUSCLES EVERY 3 MONTHS.   ELDERBERRY PO    Take 2 tablets by mouth daily.   FAMOTIDINE-CALCIUM CARBONATE-MAGNESIUM HYDROXIDE (PEPCID COMPLETE) [phone] MG CHEWABLE TABLET    Chew 1 tablet by mouth daily as needed.  Modified Medications   No medications on file  Discontinued Medications   No medications on file     No Known Allergies   REVIEW OF SYSTEMS:  GENERAL: no change in appetite, no fatigue, no weight changes, + fever SKIN: Denies rash, itching, wounds, ulcer sores, or nail abnormality NOSE:+ nasal congestion, denies epistaxis MOUTH and THROAT: Denies oral discomfort, gingival pain or bleeding RESPIRATORY: no cough, SOB, DOE, wheezing, hemoptysis CARDIAC: no chest pain, edema or palpitations GI: no abdominal pain, diarrhea, constipation, heart burn, nausea or vomiting GU: Denies dysuria, frequency, hematuria, incontinence, or discharge MUSCULOSKELETAL: Denies joit pain, muscle pain, back pain, restricted movement, or unusual weakness  NEUROLOGICAL: Denies dizziness, syncope, numbness, or headache PSYCHIATRIC: Denies feeling of depression or anxiety. No report of hallucinations, insomnia, paranoia, or agitation   LABS/RADIOLOGY: Labs reviewed: Basic Metabolic Panel: Recent Labs    09/28/18 0807 03/28/19 0831  NA 138 139  K 4.4 4.9  CL 103 104  CO2 30 29  GLUCOSE 94 97  BUN 16 19  CREATININE 1.24 1.19  CALCIUM 9.3 9.5   Liver Function Tests: Recent Labs    09/28/18 0807 03/28/19 0831  AST 22 18   ALT 16 14  BILITOT 0.5 0.6  PROT 6.5 6.9    CBC: Recent Labs    09/28/18 0807  WBC 4.4  NEUTROABS 2,785  HGB 13.9  HCT 41.6  MCV 90.2  PLT 298    Lipid Panel: Recent Labs    09/28/18 0807 03/28/19 0831  HDL 54 62    ASSESSMENT/PLAN:  1. COVID-19 -Counseled regarding care and how to prevent the spread of the disease, discussed with him importance of self quarantine, labs at [facility] scheduled for tomorrow, 09/14/2019, needs to be rescheduled when he is symptom-free.  -Instructed to drink fluids, continue Mucinex    Time spent on non face to face visit:  12 minutes  The patient gave consent to this telephone visit. Explained to the patient the risk and privacy issue that was involved with this telephone call.   The patient was advised to call back and ask for an in-person evaluation if the symptoms worsen or if the condition fails to improve.   Kenard Gower, NP [employer] [phone]

## 2019-09-13 NOTE — Patient Instructions (Signed)
Prevent the Spread of COVID-19 if You Are Sick If you are sick with COVID-19 or think you might have COVID-19, follow the steps below to help protect other people in your home and community. Stay home except to get medical care.  Stay home. Most people with COVID-19 have mild illness and are able to recover at home without medical care. Do not leave your home, except to get medical care. Do not visit public areas.  Take care of yourself. Get rest and stay hydrated.  Get medical care when needed. Call your doctor before you go to their office for care. But, if you have trouble breathing or other concerning symptoms, call 911 for immediate help.  Avoid public transportation, ride-sharing, or taxis. Separate yourself from other people and pets in your home.  As much as possible, stay in a specific room and away from other people and pets in your home. Also, you should use a separate bathroom, if available. If you need to be around other people or animals in or outside of the home, wear a cloth face covering. ? See COVID-19 and Animals if you have questions about pets: https://www.cdc.gov/coronavirus/2019-ncov/faq.html#COVID19animals Monitor your symptoms.  Common symptoms of COVID-19 include fever and cough. Trouble breathing is a more serious symptom that means you should get medical attention.  Follow care instructions from your healthcare provider and local health department. Your local health authorities will give instructions on checking your symptoms and reporting information. If you develop emergency warning signs for COVID-19 get medical attention immediately.  Emergency warning signs include*:  Trouble breathing  Persistent pain or pressure in the chest  New confusion or not able to be woken  Bluish lips or face *This list is not all inclusive. Please consult your medical provider for any other symptoms that are severe or concerning to you. Call 911 if you have a medical  emergency. If you have a medical emergency and need to call 911, notify the operator that you have or think you might have, COVID-19. If possible, put on a facemask before medical help arrives. Call ahead before visiting your doctor.  Call ahead. Many medical visits for routine care are being postponed or done by phone or telemedicine.  If you have a medical appointment that cannot be postponed, call your doctor's office. This will help the office protect themselves and other patients. If you are sick, wear a cloth covering over your nose and mouth.  You should wear a cloth face covering over your nose and mouth if you must be around other people or animals, including pets (even at home).  You don't need to wear the cloth face covering if you are alone. If you can't put on a cloth face covering (because of trouble breathing for example), cover your coughs and sneezes in some other way. Try to stay at least 6 feet away from other people. This will help protect the people around you. Note: During the COVID-19 pandemic, medical grade facemasks are reserved for healthcare workers and some first responders. You may need to make a cloth face covering using a scarf or bandana. Cover your coughs and sneezes.  Cover your mouth and nose with a tissue when you cough or sneeze.  Throw used tissues in a lined trash can.  Immediately wash your hands with soap and water for at least 20 seconds. If soap and water are not available, clean your hands with an alcohol-based hand sanitizer that contains at least 60% alcohol. Clean your hands often.    Wash your hands often with soap and water for at least 20 seconds. This is especially important after blowing your nose, coughing, or sneezing; going to the bathroom; and before eating or preparing food.  Use hand sanitizer if soap and water are not available. Use an alcohol-based hand sanitizer with at least 60% alcohol, covering all surfaces of your hands and rubbing  them together until they feel dry.  Soap and water are the best option, especially if your hands are visibly dirty.  Avoid touching your eyes, nose, and mouth with unwashed hands. Avoid sharing personal household items.  Do not share dishes, drinking glasses, cups, eating utensils, towels, or bedding with other people in your home.  Wash these items thoroughly after using them with soap and water or put them in the dishwasher. Clean all "high-touch" surfaces everyday.  Clean and disinfect high-touch surfaces in your "sick room" and bathroom. Let someone else clean and disinfect surfaces in common areas, but not your bedroom and bathroom.  If a caregiver or other person needs to clean and disinfect a sick person's bedroom or bathroom, they should do so on an as-needed basis. The caregiver/other person should wear a mask and wait as long as possible after the sick person has used the bathroom. High-touch surfaces include phones, remote controls, counters, tabletops, doorknobs, bathroom fixtures, toilets, keyboards, tablets, and bedside tables.  Clean and disinfect areas that may have blood, stool, or body fluids on them.  Use household cleaners and disinfectants. Clean the area or item with soap and water or another detergent if it is dirty. Then use a household disinfectant. ? Be sure to follow the instructions on the label to ensure safe and effective use of the product. Many products recommend keeping the surface wet for several minutes to ensure germs are killed. Many also recommend precautions such as wearing gloves and making sure you have good ventilation during use of the product. ? Most EPA-registered household disinfectants should be effective. How to discontinue home isolation  People with COVID-19 who have stayed home (home isolated) can stop home isolation under the following conditions: ? If you will not have a test to determine if you are still contagious, you can leave home  after these three things have happened:  You have had no fever for at least 72 hours (that is three full days of no fever without the use of medicine that reduces fevers) AND  other symptoms have improved (for example, when your cough or shortness of breath has improved) AND  at least 10 days have passed since your symptoms first appeared. ? If you will be tested to determine if you are still contagious, you can leave home after these three things have happened:  You no longer have a fever (without the use of medicine that reduces fevers) AND  other symptoms have improved (for example, when your cough or shortness of breath has improved) AND  you received two negative tests in a row, 24 hours apart. Your doctor will follow CDC guidelines. In all cases, follow the guidance of your healthcare provider and local health department. The decision to stop home isolation should be made in consultation with your healthcare provider and state and local health departments. Local decisions depend on local circumstances. cdc.gov/coronavirus 01/15/2019 This information is not intended to replace advice given to you by your health care provider. Make sure you discuss any questions you have with your health care provider. Document Released: 12/27/2018 Document Revised: 01/25/2019 Document Reviewed: 12/27/2018   Elsevier Patient Education  2020 [employer] Inc.  

## 2019-09-25 ENCOUNTER — Other Ambulatory Visit: Payer: 59

## 2019-09-25 ENCOUNTER — Other Ambulatory Visit: Payer: Self-pay

## 2019-09-25 DIAGNOSIS — E7800 Pure hypercholesterolemia, unspecified: Secondary | ICD-10-CM

## 2019-09-25 DIAGNOSIS — Z7289 Other problems related to lifestyle: Secondary | ICD-10-CM

## 2019-09-25 DIAGNOSIS — R5382 Chronic fatigue, unspecified: Secondary | ICD-10-CM

## 2019-09-25 DIAGNOSIS — Z789 Other specified health status: Secondary | ICD-10-CM

## 2019-09-25 DIAGNOSIS — Z114 Encounter for screening for human immunodeficiency virus [HIV]: Secondary | ICD-10-CM

## 2019-09-25 DIAGNOSIS — F1090 Alcohol use, unspecified, uncomplicated: Secondary | ICD-10-CM

## 2019-09-25 DIAGNOSIS — Z1159 Encounter for screening for other viral diseases: Secondary | ICD-10-CM

## 2019-09-26 LAB — VITAMIN B12: Vitamin B-12: 510 pg/mL (ref 200–1100)

## 2019-09-26 LAB — COMPLETE METABOLIC PANEL WITH GFR
AG Ratio: 1.4 (calc) (ref 1.0–2.5)
ALT: 18 U/L (ref 9–46)
AST: 20 U/L (ref 10–35)
Albumin: 4.4 g/dL (ref 3.6–5.1)
Alkaline phosphatase (APISO): 48 U/L (ref 35–144)
BUN: 16 mg/dL (ref 7–25)
CO2: 30 mmol/L (ref 20–32)
Calcium: 9.8 mg/dL (ref 8.6–10.3)
Chloride: 101 mmol/L (ref 98–110)
Creat: 1.02 mg/dL (ref 0.70–1.25)
GFR, Est African American: 92 mL/min/{1.73_m2} (ref >=60)
GFR, Est Non African American: 79 mL/min/{1.73_m2} (ref >=60)
Globulin: 3.1 g/dL (calc) (ref 1.9–3.7)
Glucose, Bld: 97 mg/dL (ref 65–99)
Potassium: 4.4 mmol/L (ref 3.5–5.3)
Sodium: 139 mmol/L (ref 135–146)
Total Bilirubin: 0.7 mg/dL (ref 0.2–1.2)
Total Protein: 7.5 g/dL (ref 6.1–8.1)

## 2019-09-26 LAB — CBC WITH DIFFERENTIAL/PLATELET
Absolute Monocytes: 428 cells/uL (ref 200–950)
Basophils Absolute: 51 cells/uL (ref 0–200)
Basophils Relative: 1 %
Eosinophils Absolute: 97 cells/uL (ref 15–500)
Eosinophils Relative: 1.9 %
HCT: 45 % (ref 38.5–50.0)
Hemoglobin: 15.2 g/dL (ref 13.2–17.1)
Lymphs Abs: 1091 cells/uL (ref 850–3900)
MCH: 30.6 pg (ref 27.0–33.0)
MCHC: 33.8 g/dL (ref 32.0–36.0)
MCV: 90.5 fL (ref 80.0–100.0)
MPV: 10.4 fL (ref 7.5–12.5)
Monocytes Relative: 8.4 %
Neutro Abs: 3432 cells/uL (ref 1500–7800)
Neutrophils Relative %: 67.3 %
Platelets: 472 10*3/uL — ABNORMAL HIGH (ref 140–400)
RBC: 4.97 10*6/uL (ref 4.20–5.80)
RDW: 12.2 % (ref 11.0–15.0)
Total Lymphocyte: 21.4 %
WBC: 5.1 10*3/uL (ref 3.8–10.8)

## 2019-09-26 LAB — LIPID PANEL
Cholesterol: 179 mg/dL (ref <200)
HDL: 49 mg/dL (ref >=40)
LDL Cholesterol (Calc): 108 mg/dL (calc) — ABNORMAL HIGH
Non-HDL Cholesterol (Calc): 130 mg/dL (calc) — ABNORMAL HIGH (ref <130)
Total CHOL/HDL Ratio: 3.7 (calc) (ref <5.0)
Triglycerides: 109 mg/dL (ref <150)

## 2019-09-26 LAB — HEPATITIS C ANTIBODY
Hepatitis C Ab: NONREACTIVE
SIGNAL TO CUT-OFF: 0.01 (ref <1.00)

## 2019-09-26 LAB — HIV ANTIBODY (ROUTINE TESTING W REFLEX): HIV 1&2 Ab, 4th Generation: NONREACTIVE

## 2019-09-26 NOTE — Progress Notes (Signed)
Bad cholesterol is a little bit better.  Kidneys, liver and electrolytes are all normal.  B12 is normal.  He has some elevation of his platelets which we'll monitor.  We'll review at his appt

## 2019-09-28 ENCOUNTER — Encounter: Payer: Self-pay | Admitting: Internal Medicine

## 2019-09-28 ENCOUNTER — Ambulatory Visit: Payer: 59 | Admitting: Internal Medicine

## 2019-09-28 ENCOUNTER — Other Ambulatory Visit: Payer: Self-pay

## 2019-09-28 ENCOUNTER — Ambulatory Visit (INDEPENDENT_AMBULATORY_CARE_PROVIDER_SITE_OTHER): Payer: 59 | Admitting: Internal Medicine

## 2019-09-28 VITALS — BP 138/82 | HR 70 | Temp 97.5°F | Ht 68.0 in | Wt 212.0 lb

## 2019-09-28 DIAGNOSIS — Z0000 Encounter for general adult medical examination without abnormal findings: Secondary | ICD-10-CM | POA: Diagnosis not present

## 2019-09-28 DIAGNOSIS — Z789 Other specified health status: Secondary | ICD-10-CM

## 2019-09-28 DIAGNOSIS — F1090 Alcohol use, unspecified, uncomplicated: Secondary | ICD-10-CM

## 2019-09-28 DIAGNOSIS — Z7289 Other problems related to lifestyle: Secondary | ICD-10-CM

## 2019-09-28 DIAGNOSIS — G243 Spasmodic torticollis: Secondary | ICD-10-CM | POA: Diagnosis not present

## 2019-09-28 DIAGNOSIS — K219 Gastro-esophageal reflux disease without esophagitis: Secondary | ICD-10-CM

## 2019-09-28 DIAGNOSIS — E7800 Pure hypercholesterolemia, unspecified: Secondary | ICD-10-CM

## 2019-09-28 DIAGNOSIS — M25561 Pain in right knee: Secondary | ICD-10-CM

## 2019-09-28 NOTE — Patient Instructions (Addendum)
Please do the cologuard test.

## 2019-09-28 NOTE — Progress Notes (Signed)
Provider:  Rexene Edison. Mariea Clonts, D.O., C.M.D. Location:   [facility]   Place of Service:   clinic  Previous PCP: Gayland Curry, DO Patient Care Team: Gayland Curry, DO as PCP - General (Geriatric Medicine)  Extended Emergency Contact Information Primary Emergency Contact: [facility] Address: [address]          [place], [place] [address] Johnnette Litter of [place] Phone: [phone] Relation: Spouse Secondary Emergency Contact: Constance Holster Address: [address]          [place], [place] [address] Johnnette Litter of [place] Phone: [phone] Work Phone: [phone] Mobile Phone: [phone] Relation: Father  Goals of Care: Advanced Directive information Advanced Directives 09/28/2019  Does Patient Have a Medical Advance Directive? No  Does patient want to make changes to medical advance directive? Yes (ED - Information included in AVS)  Would patient like information on creating a medical advance directive? -      Chief Complaint  Patient presents with  . Annual Exam    CPE    HPI: Patient is a 64 y.o. male seen today for an annual physical exam.  Covid:  He had one day of fever, sinus congestion, did lose sense of smell but not taste.  Had a little cough with the sinus congestion.  He did have one negative test last week b/c of taking care of his parents.  His dad had also tested positive last week with [facility].  fri test at [facility] negative.  Says he's going again today to be tested.  I advised against being tested again b/c he's likely to stay positive for 3 months or so afterward.      He feels great now.  Started back working out this week.  Diet has been crappy.  He was w/o drinking wine for three weeks while he had covid.    They have plans and wondering what's going to happen with travel plans.    Past Medical History:  Diagnosis Date  . Cervical dystonia   . Transaminitis    Past Surgical History:  Procedure Laterality Date  . TONSILLECTOMY   1963    reports that he has never smoked. He has never used smokeless tobacco. He reports current alcohol use of about 2.0 standard drinks of alcohol per week. He reports that he does not use drugs.  Functional Status Survey:    Family History  Problem Relation Age of Onset  . Bipolar disorder Mother   . Dementia Mother   . Parkinson's disease Mother   . Memory loss Father     Health Maintenance  Topic Date Due  . COLONOSCOPY  12/29/2007  . INFLUENZA VACCINE  12/13/2019 (Originally 04/15/2019)  . TETANUS/TDAP  07/30/2026 (Originally 12/28/1976)  . Hepatitis C Screening  Completed  . HIV Screening  Completed    No Known Allergies  Outpatient Encounter Medications as of 09/28/2019  Medication Sig  . AbobotulinumtoxinA (DYSPORT) 500 units SOLR injection DYSPORT 1000 UNITS TO BE ADMINISTERED TO NECK MUSCLES EVERY 3 MONTHS.  Marland Kitchen ELDERBERRY PO Take 2 tablets by mouth daily.  . famotidine-calcium carbonate-magnesium hydroxide (PEPCID COMPLETE) [phone] MG chewable tablet Chew 1 tablet by mouth daily as needed.  . ID NOW COVID-19 KIT See admin instructions.   No facility-administered encounter medications on file as of 09/28/2019.    Review of Systems  Constitutional: Negative for chills, fever and malaise/fatigue.  HENT: Negative for congestion, hearing loss and sore throat.   Eyes: Negative for blurred vision.  Respiratory:  Negative for cough and shortness of breath.   Cardiovascular: Negative for chest pain, palpitations and leg swelling.  Gastrointestinal: Negative for abdominal pain, blood in stool, constipation, diarrhea and melena.  Genitourinary: Negative for dysuria.  Musculoskeletal: Negative for falls, joint pain and myalgias.  Skin: Negative for itching and rash.  Neurological: Positive for tremors. Negative for dizziness and loss of consciousness.  Endo/Heme/Allergies: Does not bruise/bleed easily.  Psychiatric/Behavioral: Negative for depression and memory loss. The  patient does not have insomnia.     Vitals:   09/28/19 1004  BP: 138/82  Pulse: 70  Temp: (!) 97.5 F (36.4 C)  TempSrc: Temporal  SpO2: 97%  Weight: 212 lb (96.2 kg)  Height: '5\' 8"'$  (1.727 m)   Body mass index is 32.23 kg/m. Physical Exam Vitals reviewed.  Constitutional:      General: He is not in acute distress.    Appearance: Normal appearance. He is not ill-appearing or toxic-appearing.  HENT:     Head: Normocephalic and atraumatic.     Right Ear: Tympanic membrane, ear canal and external ear normal.     Left Ear: Tympanic membrane, ear canal and external ear normal.     Nose:     Comments: Nose and mouth deferred due to covid masking and no related concerns Eyes:     Extraocular Movements: Extraocular movements intact.     Conjunctiva/sclera: Conjunctivae normal.     Pupils: Pupils are equal, round, and reactive to light.  Cardiovascular:     Rate and Rhythm: Normal rate and regular rhythm.     Pulses: Normal pulses.     Heart sounds: Normal heart sounds.  Pulmonary:     Effort: Pulmonary effort is normal.     Breath sounds: Normal breath sounds. No wheezing, rhonchi or rales.  Abdominal:     General: Bowel sounds are normal. There is no distension.     Palpations: Abdomen is soft. There is no mass.     Tenderness: There is no abdominal tenderness. There is no guarding or rebound.  Musculoskeletal:        General: Normal range of motion.     Cervical back: Neck supple. No rigidity or tenderness.     Right lower leg: No edema.     Left lower leg: No edema.  Lymphadenopathy:     Cervical: No cervical adenopathy.  Skin:    General: Skin is warm and dry.     Capillary Refill: Capillary refill takes less than 2 seconds.  Neurological:     General: No focal deficit present.     Mental Status: He is alert and oriented to person, place, and time. Mental status is at baseline.     Cranial Nerves: No cranial nerve deficit.     Sensory: No sensory deficit.      Motor: No weakness.     Coordination: Coordination normal.     Gait: Gait normal.     Deep Tendon Reflexes: Reflexes normal.  Psychiatric:        Mood and Affect: Mood normal.        Behavior: Behavior normal.        Thought Content: Thought content normal.        Judgment: Judgment normal.     Labs reviewed: Basic Metabolic Panel: Recent Labs    03/28/19 0831 09/25/19 0830  NA 139 139  K 4.9 4.4  CL 104 101  CO2 29 30  GLUCOSE 97 97  BUN 19 16  CREATININE  1.19 1.02  CALCIUM 9.5 9.8   Liver Function Tests: Recent Labs    03/28/19 0831 09/25/19 0830  AST 18 20  ALT 14 18  BILITOT 0.6 0.7  PROT 6.9 7.5   No results for input(s): LIPASE, AMYLASE in the last 8760 hours. No results for input(s): AMMONIA in the last 8760 hours. CBC: Recent Labs    09/25/19 0830  WBC 5.1  NEUTROABS 3,432  HGB 15.2  HCT 45.0  MCV 90.5  PLT 472*   Cardiac Enzymes: No results for input(s): CKTOTAL, CKMB, CKMBINDEX, TROPONINI in the last 8760 hours. BNP: Invalid input(s): POCBNP Lab Results  Component Value Date   HGBA1C 5.1 03/28/2019   No results found for: TSH Lab Results  Component Value Date   VITAMINB12 510 09/25/2019   Assessment/Plan 1. Annual physical exam -performed today  2. Pure hypercholesterolemia -did not tolerate cholesterol meds and is doing his best to manage with diet and exercise -counseled about cheese intake which has been a big contributor  3. Alcohol use -advised he should not drink more than 2 glasses of wine per day -he's aware he needs to reduce his wine intake in order to lose weight as he desires to do  4. Cervical dystonia -continues with botox   5. Gastroesophageal reflux disease without esophagitis -uses prn pepcid complete -not helped by his wine intake  6. Acute pain of right knee -healed, back to running  Again reminded to do his cologuard kit which has been serving as a toilet paper holder in his bathroom.  Labs/tests  ordered:   Lab Orders     81moBCwdiffplt     644moPGFR     51m40mo     51mo68moc  F/u in 6 mos med mgt with fasting labs before  Tzion Wedel L. Jarrah Babich, D.O. [facility] [address] [place] [id number] Phone (Mon-Fri 8am-5pm):  [phone]:  [phone] prompts after 5pm & weekends Office Phone:  [phone] Fax:  [phone]

## 2019-10-25 IMAGING — CR RIGHT KNEE - COMPLETE 4+ VIEW
4 series · 4 of 4 positions shown · non-contrast
Comparison: None.

CLINICAL DATA: Twisting injury 8 days ago with intermittent
anteromedial knee pain and swelling.

EXAM:
RIGHT KNEE - COMPLETE 4+ VIEW

[w knee ap right]
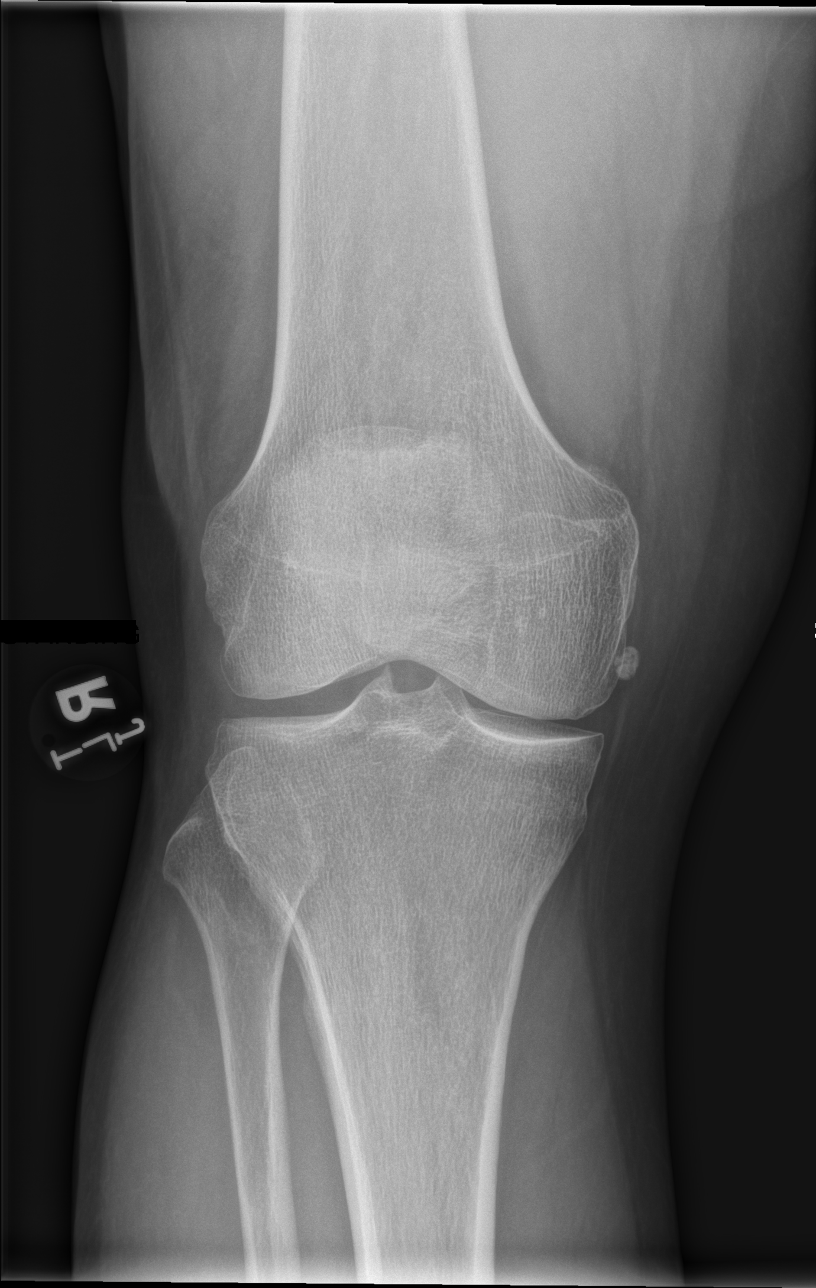

[w knee lat right]
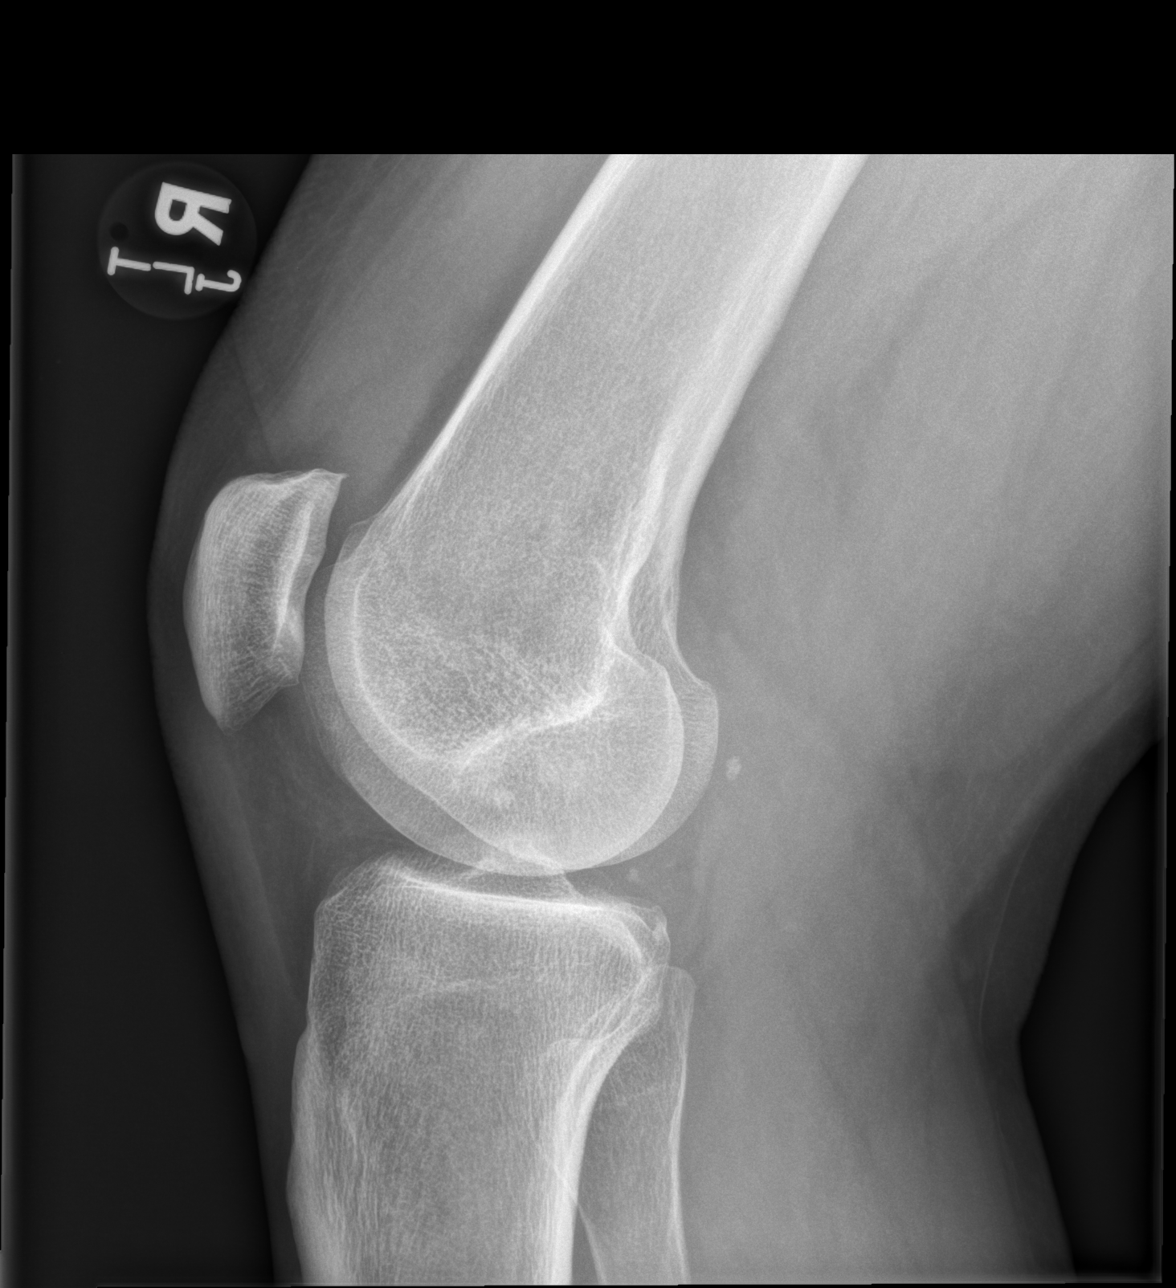

[w knee tunnel pa right]
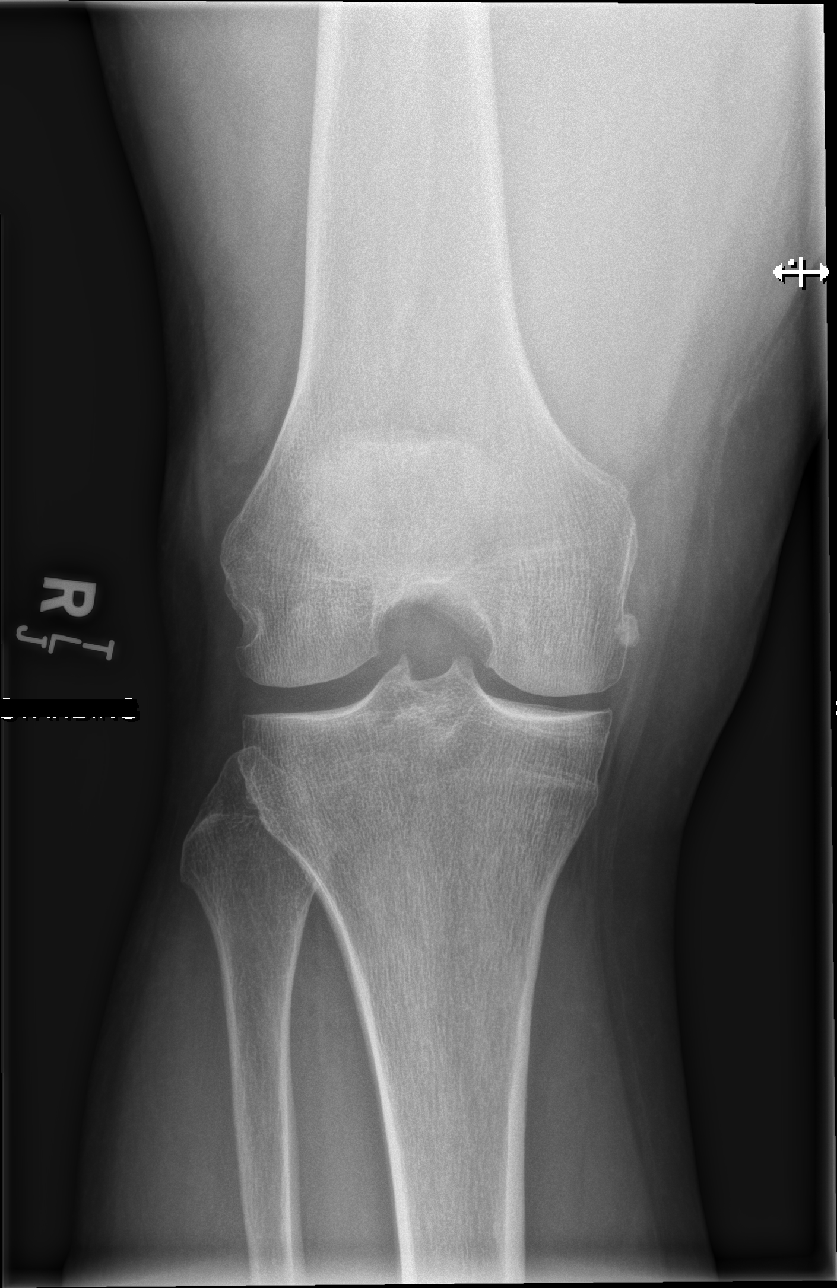

[x knee sunrise right]
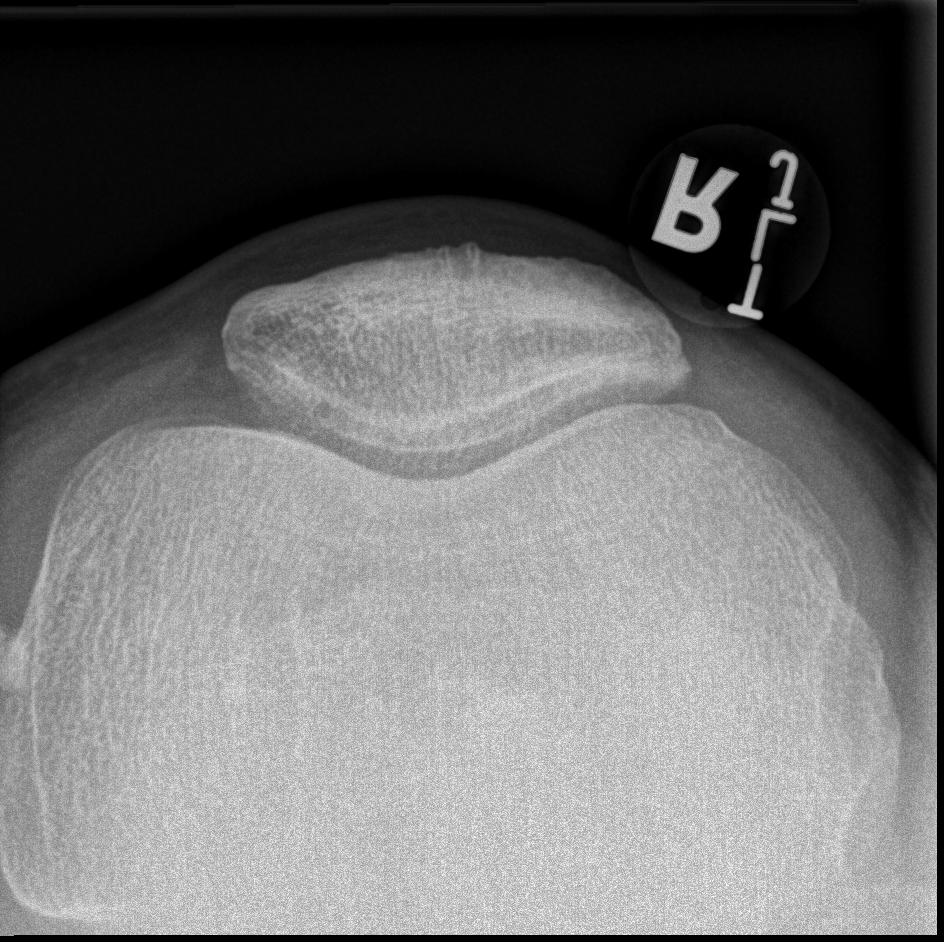

[4 of 4 positions shown; findings below may reference images not displayed]

FINDINGS: The mineralization and alignment are normal. There is no evidence of
acute fracture or dislocation. There are mild degenerative changes
in the medial and patellofemoral compartments. There is an amorphous
calcification adjacent to the medial femoral condyle which could
reflect a loose body or hydroxyapatite deposition. There is a
moderate size knee joint effusion.
IMPRESSION: No acute osseous findings. Degenerative changes with moderate-sized
joint effusion and possible loose body versus hydroxyapatite
deposition medially.

## 2019-11-15 ENCOUNTER — Ambulatory Visit: Payer: 59 | Admitting: Neurology

## 2019-11-29 ENCOUNTER — Telehealth: Payer: Self-pay | Admitting: *Deleted

## 2019-11-29 NOTE — Telephone Encounter (Signed)
I called [employer] and spoke to [place].  He states that [id number], [phone] and [phone] are covered and do not require PA.  Ref# for call is [id number].

## 2019-12-06 ENCOUNTER — Other Ambulatory Visit: Payer: Self-pay

## 2019-12-06 ENCOUNTER — Encounter: Payer: Self-pay | Admitting: Neurology

## 2019-12-06 ENCOUNTER — Ambulatory Visit: Payer: 59 | Admitting: Neurology

## 2019-12-06 VITALS — BP 144/89 | HR 73 | Temp 97.6°F | Ht 68.0 in | Wt 212.5 lb

## 2019-12-06 DIAGNOSIS — G243 Spasmodic torticollis: Secondary | ICD-10-CM

## 2019-12-06 NOTE — Progress Notes (Signed)
**  Dysport 500 units x 2 vials, NDC [address]-6, Lot [id number], Exp 06/13/2020, specialty pharmacy.//mck,rn**

## 2019-12-06 NOTE — Progress Notes (Signed)
PATIENT: Andrew Bentley DOB: 06-21-1956  HISTORICAL  Daran Favaro is a 64 years old right-handed Caucasian male, came back for revisit, for potential EMG guided Botox injection for his cervical dystonia  He had a gradual onset neck turning to the left side, abnormal neck posture since 2005, progressively worse since 2008, he was enrolled in Dysport research trial sponsored by [employer] since 2013, he totally received 4 injection, last injection was November 2013, complete the study successfully in April 2014, which is his last office visit.   During the trial, he receive EMG guided dysport injection every 3 months, 500 units, he tolerated the injection very well, showed significant improvement of his neck posturing  He continue to works out regularly, he denies gait difficulty, he complains of left occipital area radiating pain to his left parietal area, he denies radiating pain from his neck to his arms, and hands  UPDATE Jan 20th 2016: He signed a consent form, also documented under communication dated October 03 2014, potential side effect explained, he had a bulky right sternocleidomastoid, left posterior cervical paraspinal muscles.  UPDATE February 13 2015: He responded very well to previous injection in October 03 2014,he received 500 units of Dysport, he can move his neck better, no significant side effect, no neck extension weakness, no swallowing difficulties  He has almost constant head turning to the left side even at the peak benefit of the Dysport injection  UPDATE May 11th 2017: Last injection was in Sept 2016, he received Dysport 500 units times 2, he had transient dysphagia, and dysarthria lasting for couple weeks, otherwise injection was very helpful, he continued to exercise regularly, receiving chiropractic adjustment regularly  Update April 29 2016:  He responded very well to previous injection Dysport 500 unitsx2 in May 2017, did reported side effect of mild transient  swallowing difficulty, weak voice, difficulty extending his neck,  Update September 03 2016, He responded very well to previous injectionsignificant side effect notice,  UPDATE Jan 13 2017: He had returning of frequent neck no no shaking movement, forceful pushing towards the left side, also lean towards his left shoulder, he has right posterior neck pain  Update April 21 2017: Responded well to previous injection, he has significant posterior neck muscle atrophy, did complains of mild hoarse voice after injection  UPDATE Jul 23 2017: He respond well to previous injection, recent onset of low back pain  UPDATE Nov 03 2017: He has no significant side effect from previous injection, complains of left side of the neck muscle tension,  UPDATE Feb 02 2018: He responded very well to previous injection, no significant side effect noticed.  UPDATE May 12 2018: He is doing very well with his injection, workout regularly,  UPDATE Aug 31 2018: He responded very well to previous injection, complains of right posterior neck muscle tension  UPDATE Jan 25 2019: He responded very well to previous injection  UPDATE May 09 2019: He responded well to previous injection  UPDATE Aug 15 2019: He has mild left-sided neck pain,  UPDATE December 06 2019: He did well with previous injection, also receiving massage therapy  REVIEW OF SYSTEMS: Full 14 system review of systems performed and notable only for as above  ALLERGIES: No Known Allergies  HOME MEDICATIONS: Current Outpatient Medications on File Prior to Visit  Medication Sig Dispense Refill  . AbobotulinumtoxinA (DYSPORT) 500 units SOLR injection DYSPORT 1000 UNITS TO BE ADMINISTERED TO NECK MUSCLES EVERY 3 MONTHS. 2 each 3  . ELDERBERRY  PO Take 2 tablets by mouth daily.    . famotidine-calcium carbonate-magnesium hydroxide (PEPCID COMPLETE) [phone] MG chewable tablet Chew 1 tablet by mouth daily as needed.     No current  facility-administered medications on file prior to visit.    PAST MEDICAL HISTORY: Past Medical History:  Diagnosis Date  . Cervical dystonia   . Transaminitis     PAST SURGICAL HISTORY: Past Surgical History:  Procedure Laterality Date  . TONSILLECTOMY  1963    FAMILY HISTORY: Family History  Problem Relation Age of Onset  . Bipolar disorder Mother   . Dementia Mother   . Parkinson's disease Mother   . Memory loss Father     SOCIAL HISTORY:  Social History   Socioeconomic History  . Marital status: Married    Spouse name: Not on file  . Number of children: 0  . Years of education: college  . Highest education level: Not on file  Occupational History    Comment: Self Emp.  Tobacco Use  . Smoking status: Never Smoker  . Smokeless tobacco: Never Used  Substance and Sexual Activity  . Alcohol use: Yes    Alcohol/week: 2.0 standard drinks    Types: 2 Glasses of wine per week    Comment: Red wine with dinner  . Drug use: No  . Sexual activity: Not on file  Other Topics Concern  . Not on file  Social History Narrative   Patient lives at home (one stories) with his wife Bevely Palmer, married since 1985    Patient works full time Cabin crew.  VP Sales   Education college education.   Right handed.   Caffeine two cups of coffee daily.   2 dogs, 1 cat   Exercises    Social Determinants of Health   Financial Resource Strain:   . Difficulty of Paying Living Expenses:   Food Insecurity:   . Worried About Charity fundraiser in the Last Year:   . Arboriculturist in the Last Year:   Transportation Needs:   . Film/video editor (Medical):   Marland Kitchen Lack of Transportation (Non-Medical):   Physical Activity:   . Days of Exercise per Week:   . Minutes of Exercise per Session:   Stress:   . Feeling of Stress :   Social Connections:   . Frequency of Communication with Friends and Family:   . Frequency of Social Gatherings with Friends and Family:   . Attends Religious  Services:   . Active Member of Clubs or Organizations:   . Attends Archivist Meetings:   Marland Kitchen Marital Status:   Intimate Partner Violence:   . Fear of Current or Ex-Partner:   . Emotionally Abused:   Marland Kitchen Physically Abused:   . Sexually Abused:      PHYSICAL EXAM   Vitals:   12/06/19 1541  BP: (!) 144/89  Pulse: 73  Temp: 97.6 F (36.4 C)  Weight: 212 lb 8 oz (96.4 kg)  Height: 5\' 8"  (1.727 m)    Not recorded      Body mass index is 32.31 kg/m.  DIAGNOSTIC DATA (LABS, IMAGING, TESTING) - I reviewed patient records, labs, notes, testing and imaging myself where available.  ASSESSMENT AND PLAN  Maxden Naji is a 64 y.o. male with long-standing history of cervical dystonia, responded very well to previous EMG guided dysport injection.  He has moderate left turning, mild retrocollis, mild right laterocollis, mild left shoulder elevation, frequent no-no head shaking  Under EMG guidance, 500 x2 =1,000 units of Dysport were injected (500 /2.5 cc for total of 5.0 cc)    Right sternocleidomastoid 0.5 ccx2=1.0 cc  Left inferior oblique capitis 0.5 cc Right inferior oblique capitis 0.5 cc  Left posterior scalenes 0.5 cc Left longissimus capitis 0.5 cc Left levator scapular 0.5 cc Left splenius capitis 0.5 cc Left splenius cervix 0.5 cc Left iliocostalis 0.5 cc  Return to clinic in 3 months  Levert Feinstein, M.D. Ph.D.  [facility] [address] [place], [place] [address] [phone]

## 2020-02-19 ENCOUNTER — Telehealth: Payer: Self-pay | Admitting: Neurology

## 2020-02-19 NOTE — Telephone Encounter (Signed)
I called [facility] [phone]) to see if I could schedule delivery of patient's medication for his 6/23 appointment. I spoke with Hilda Lias who states that they need to do a medical benefit review for this month. She then sent me to Gerre Pebbles in the pharmacy who asked for a new prescription because current one expires 02/29/20. I gave a verbal prescription for (2) 500U vials of Dysport with the directions of 1000 UNITS TO BE ADMINISTERED TO NECK MUSCLES EVERY 3 MONTHS. 3 refills. Gerre Pebbles then sent me back to Hilda Lias who scheduled a tentative delivery of 02/29/20.

## 2020-02-29 NOTE — Telephone Encounter (Signed)
Medication delivered 02/29/20 for patient's 6/23 appt. (2) 500U vials of Dysport. Placed in fridge.

## 2020-03-06 ENCOUNTER — Ambulatory Visit: Payer: 59 | Admitting: Neurology

## 2020-03-06 ENCOUNTER — Encounter: Payer: Self-pay | Admitting: Neurology

## 2020-03-06 VITALS — BP 157/92 | HR 82 | Ht 68.0 in | Wt 197.5 lb

## 2020-03-06 DIAGNOSIS — G243 Spasmodic torticollis: Secondary | ICD-10-CM

## 2020-03-06 NOTE — Progress Notes (Signed)
**  Dysport 500 units x 2 vials, NDC [address]-5, Lot [id number], Exp 10/14/20, specialty pharmacy.//mck,rn**

## 2020-03-06 NOTE — Progress Notes (Signed)
PATIENT: Andrew Bentley DOB: 25-Jun-1956  HISTORICAL  Andrew Bentley is a 64 years old right-handed Caucasian male, came back for revisit, for potential EMG guided Botox injection for his cervical dystonia  He had a gradual onset neck turning to the left side, abnormal neck posture since 2005, progressively worse since 2008, he was enrolled in Dysport research trial sponsored by [employer] since 2013, he totally received 4 injection, last injection was November 2013, complete the study successfully in April 2014, which is his last office visit.   During the trial, he receive EMG guided dysport injection every 3 months, 500 units, he tolerated the injection very well, showed significant improvement of his neck posturing  He continue to works out regularly, he denies gait difficulty, he complains of left occipital area radiating pain to his left parietal area, he denies radiating pain from his neck to his arms, and hands  UPDATE Jan 20th 2016: He signed a consent form, also documented under communication dated October 03 2014, potential side effect explained, he had a bulky right sternocleidomastoid, left posterior cervical paraspinal muscles.  UPDATE February 13 2015: He responded very well to previous injection in October 03 2014,he received 500 units of Dysport, he can move his neck better, no significant side effect, no neck extension weakness, no swallowing difficulties  He has almost constant head turning to the left side even at the peak benefit of the Dysport injection  UPDATE May 11th 2017: Last injection was in Sept 2016, he received Dysport 500 units times 2, he had transient dysphagia, and dysarthria lasting for couple weeks, otherwise injection was very helpful, he continued to exercise regularly, receiving chiropractic adjustment regularly  Update April 29 2016:  He responded very well to previous injection Dysport 500 unitsx2 in May 2017, did reported side effect of mild transient  swallowing difficulty, weak voice, difficulty extending his neck,  Update September 03 2016, He responded very well to previous injectionsignificant side effect notice,  UPDATE Jan 13 2017: He had returning of frequent neck no no shaking movement, forceful pushing towards the left side, also lean towards his left shoulder, he has right posterior neck pain  Update April 21 2017: Responded well to previous injection, he has significant posterior neck muscle atrophy, did complains of mild hoarse voice after injection  UPDATE Jul 23 2017: He respond well to previous injection, recent onset of low back pain  UPDATE Nov 03 2017: He has no significant side effect from previous injection, complains of left side of the neck muscle tension,  UPDATE Feb 02 2018: He responded very well to previous injection, no significant side effect noticed.  UPDATE May 12 2018: He is doing very well with his injection, workout regularly,  UPDATE Aug 31 2018: He responded very well to previous injection, complains of right posterior neck muscle tension  UPDATE Jan 25 2019: He responded very well to previous injection  UPDATE May 09 2019: He responded well to previous injection  UPDATE Aug 15 2019: He has mild left-sided neck pain,  UPDATE December 06 2019: He did well with previous injection, also receiving massage therapy  Update March 06, 2020: He has significant atrophy of left posterior cervical muscle, is receiving massage therapy, chiropractor regularly which has been helpful,  REVIEW OF SYSTEMS: Full 14 system review of systems performed and notable only for as above  ALLERGIES: No Known Allergies  HOME MEDICATIONS: Current Outpatient Medications on File Prior to Visit  Medication Sig Dispense Refill  .  AbobotulinumtoxinA (DYSPORT) 500 units SOLR injection DYSPORT 1000 UNITS TO BE ADMINISTERED TO NECK MUSCLES EVERY 3 MONTHS. 2 each 3  . ELDERBERRY PO Take 2 tablets by mouth daily.    .  famotidine-calcium carbonate-magnesium hydroxide (PEPCID COMPLETE) [phone] MG chewable tablet Chew 1 tablet by mouth daily as needed.     No current facility-administered medications on file prior to visit.    PAST MEDICAL HISTORY: Past Medical History:  Diagnosis Date  . Cervical dystonia   . Transaminitis     PAST SURGICAL HISTORY: Past Surgical History:  Procedure Laterality Date  . TONSILLECTOMY  1963    FAMILY HISTORY: Family History  Problem Relation Age of Onset  . Bipolar disorder Mother   . Dementia Mother   . Parkinson's disease Mother   . Memory loss Father     SOCIAL HISTORY:  Social History   Socioeconomic History  . Marital status: Married    Spouse name: Not on file  . Number of children: 0  . Years of education: college  . Highest education level: Not on file  Occupational History    Comment: Self Emp.  Tobacco Use  . Smoking status: Never Smoker  . Smokeless tobacco: Never Used  Vaping Use  . Vaping Use: Never used  Substance and Sexual Activity  . Alcohol use: Yes    Alcohol/week: 2.0 standard drinks    Types: 2 Glasses of wine per week    Comment: Red wine with dinner  . Drug use: No  . Sexual activity: Not on file  Other Topics Concern  . 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  VP Sales   Education college education.   Right handed.   Caffeine two cups of coffee daily.   2 dogs, 1 cat   Exercises    Social Determinants of Health   Financial Resource Strain:   . Difficulty of Paying Living Expenses:   Food Insecurity:   . Worried About Programme researcher, broadcasting/film/video in the Last Year:   . Barista in the Last Year:   Transportation Needs:   . Freight forwarder (Medical):   Marland Kitchen Lack of Transportation (Non-Medical):   Physical Activity:   . Days of Exercise per Week:   . Minutes of Exercise per Session:     Stress:   . Feeling of Stress :   Social Connections:   . Frequency of Communication with Friends and Family:   . Frequency of Social Gatherings with Friends and Family:   . Attends Religious Services:   . Active Member of Clubs or Organizations:   . Attends Banker Meetings:   Marland Kitchen Marital Status:   Intimate Partner Violence:   . Fear of Current or Ex-Partner:   . Emotionally Abused:   Marland Kitchen Physically Abused:   . Sexually Abused:      PHYSICAL EXAM   Vitals:   03/06/20 1126  BP: (!) 157/92  Pulse: 82  Weight: 197 lb 8 oz (89.6 kg)  Height: 5\' 8"  (1.727 m)   Not recorded     Body mass index is 30.03 kg/m.  DIAGNOSTIC DATA (LABS, IMAGING, TESTING) - I reviewed patient records, labs, notes, testing and imaging myself where available.  ASSESSMENT AND PLAN  Andrew Bentley is a 64 y.o. male with long-standing history of cervical dystonia, responded very well  to previous EMG guided dysport injection.  He has moderate left turning, mild retrocollis, mild right laterocollis, mild left shoulder elevation, frequent no-no head shaking, significant atrophy of left posterior neck muscle  Under EMG guidance, 500 x2 =1,000 units of Dysport were injected (500 /2.5 cc for total of 5.0 cc)    Right sternocleidomastoid 0.5 ccx2=1.0 cc  Left inferior oblique capitis 0.5 cc Left posterior scalene 0.5 cc  Right splenius capitis 0.5 cc  Left posterior scalenes 0.5 cc Left longissimus capitis 0.5 cc Left levator scapular 0.5 cc Left splenius capitis 0.5 cc Left splenius cervix 0.5 cc Left iliocostalis 0.5 cc  Return to clinic in 3 months  Levert Feinstein, M.D. Ph.D.  [facility] [address] [place], [place] [address] [phone]

## 2020-03-20 ENCOUNTER — Ambulatory Visit: Payer: 59 | Admitting: Neurology

## 2020-03-25 ENCOUNTER — Other Ambulatory Visit: Payer: Self-pay

## 2020-03-25 ENCOUNTER — Other Ambulatory Visit: Payer: 59

## 2020-03-25 DIAGNOSIS — Z789 Other specified health status: Secondary | ICD-10-CM

## 2020-03-25 DIAGNOSIS — F1090 Alcohol use, unspecified, uncomplicated: Secondary | ICD-10-CM

## 2020-03-25 DIAGNOSIS — E7800 Pure hypercholesterolemia, unspecified: Secondary | ICD-10-CM

## 2020-03-25 DIAGNOSIS — Z0000 Encounter for general adult medical examination without abnormal findings: Secondary | ICD-10-CM

## 2020-03-26 LAB — CBC WITH DIFFERENTIAL/PLATELET
Absolute Monocytes: 430 cells/uL (ref 200–950)
Basophils Absolute: 40 cells/uL (ref 0–200)
Basophils Relative: 0.8 %
Eosinophils Absolute: 150 cells/uL (ref 15–500)
Eosinophils Relative: 3 %
HCT: 44 % (ref 38.5–50.0)
Hemoglobin: 14.6 g/dL (ref 13.2–17.1)
Lymphs Abs: 925 cells/uL (ref 850–3900)
MCH: 31.5 pg (ref 27.0–33.0)
MCHC: 33.2 g/dL (ref 32.0–36.0)
MCV: 95 fL (ref 80.0–100.0)
MPV: 10.8 fL (ref 7.5–12.5)
Monocytes Relative: 8.6 %
Neutro Abs: 3455 cells/uL (ref 1500–7800)
Neutrophils Relative %: 69.1 %
Platelets: 276 10*3/uL (ref 140–400)
RBC: 4.63 10*6/uL (ref 4.20–5.80)
RDW: 13.1 % (ref 11.0–15.0)
Total Lymphocyte: 18.5 %
WBC: 5 10*3/uL (ref 3.8–10.8)

## 2020-03-26 LAB — HEMOGLOBIN A1C
Hgb A1c MFr Bld: 4.9 % of total Hgb (ref <5.7)
Mean Plasma Glucose: 94 (calc)
eAG (mmol/L): 5.2 (calc)

## 2020-03-26 LAB — COMPLETE METABOLIC PANEL WITH GFR
AG Ratio: 1.9 (calc) (ref 1.0–2.5)
ALT: 21 U/L (ref 9–46)
AST: 21 U/L (ref 10–35)
Albumin: 4.2 g/dL (ref 3.6–5.1)
Alkaline phosphatase (APISO): 59 U/L (ref 35–144)
BUN: 20 mg/dL (ref 7–25)
CO2: 29 mmol/L (ref 20–32)
Calcium: 9.2 mg/dL (ref 8.6–10.3)
Chloride: 103 mmol/L (ref 98–110)
Creat: 1.05 mg/dL (ref 0.70–1.25)
GFR, Est African American: 88 mL/min/{1.73_m2} (ref >=60)
GFR, Est Non African American: 76 mL/min/{1.73_m2} (ref >=60)
Globulin: 2.2 g/dL (calc) (ref 1.9–3.7)
Glucose, Bld: 93 mg/dL (ref 65–99)
Potassium: 4.7 mmol/L (ref 3.5–5.3)
Sodium: 137 mmol/L (ref 135–146)
Total Bilirubin: 0.7 mg/dL (ref 0.2–1.2)
Total Protein: 6.4 g/dL (ref 6.1–8.1)

## 2020-03-26 LAB — LIPID PANEL
Cholesterol: 170 mg/dL (ref <200)
HDL: 64 mg/dL (ref >=40)
LDL Cholesterol (Calc): 91 mg/dL (calc)
Non-HDL Cholesterol (Calc): 106 mg/dL (calc) (ref <130)
Total CHOL/HDL Ratio: 2.7 (calc) (ref <5.0)
Triglycerides: 67 mg/dL (ref <150)

## 2020-03-26 NOTE — Progress Notes (Signed)
175 lbs is a reasonable goal which is an adjusted body weight.  We'll discuss at his visit.

## 2020-03-26 NOTE — Progress Notes (Signed)
Labs look great overall.  Even cholesterol is improved.

## 2020-03-28 ENCOUNTER — Ambulatory Visit (INDEPENDENT_AMBULATORY_CARE_PROVIDER_SITE_OTHER): Payer: 59 | Admitting: Internal Medicine

## 2020-03-28 ENCOUNTER — Other Ambulatory Visit: Payer: Self-pay

## 2020-03-28 ENCOUNTER — Encounter: Payer: Self-pay | Admitting: Internal Medicine

## 2020-03-28 VITALS — BP 122/82 | HR 71 | Temp 97.7°F | Ht 68.0 in | Wt 195.0 lb

## 2020-03-28 DIAGNOSIS — Z6829 Body mass index (BMI) 29.0-29.9, adult: Secondary | ICD-10-CM

## 2020-03-28 DIAGNOSIS — E7800 Pure hypercholesterolemia, unspecified: Secondary | ICD-10-CM

## 2020-03-28 DIAGNOSIS — E663 Overweight: Secondary | ICD-10-CM

## 2020-03-28 DIAGNOSIS — K219 Gastro-esophageal reflux disease without esophagitis: Secondary | ICD-10-CM

## 2020-03-28 DIAGNOSIS — G243 Spasmodic torticollis: Secondary | ICD-10-CM | POA: Diagnosis not present

## 2020-03-28 NOTE — Patient Instructions (Signed)
Please get your living will and HCPOA completed and bring [place] a copy.    Please do your cologuard.  We'll get the company to send you a new one.

## 2020-03-28 NOTE — Progress Notes (Signed)
Location:  [facility] clinic Provider:  Sanaa Zilberman L. Renato Gails, D.O., C.M.D.  Goals of Care:  Advanced Directives 03/28/2020  Does Patient Have a Medical Advance Directive? No  Does patient want to make changes to medical advance directive? -  Would patient like information on creating a medical advance directive? No - Patient declined     Chief Complaint  Patient presents with  . Medical Management of Chronic Issues    medical management    HPI: Patient is a 64 y.o. male seen today for medical management of chronic diseases.    He's lost a little over 20 lbs.  He is doing a plant protein shake.  Has cut out a lot of bread in his diet.  He's not needing pepcid anymore.  [employer]aur[employer] protein--has 100 calories.  His wife is on it, too.  He's still working out three times a week.  They're also doing a factor meal that they share--keto-friendly meal.   He's still drinking wine, coffee and water.    He still has not done the cologuard.  Usually takes care of that at the office not home so may need to get a new one.  Needs a new one b/c he's certain it's expired.  Admits he has a nephew with colon cancer.  If he fasts, he gets a little lightheaded.  He got dehydrated after being in [place], [place].  He did struggle out there one day with sob and HR elevated in low 100s.    Had his usual botox dysports for the cervical dystonia.  He does note after a week, his swallowing and chewing might be affected a little.    Knee is back to normal.  Past Medical History:  Diagnosis Date  . Cervical dystonia   . Transaminitis     Past Surgical History:  Procedure Laterality Date  . TONSILLECTOMY  1963   No Known Allergies  Outpatient Encounter Medications as of 03/28/2020  Medication Sig  . AbobotulinumtoxinA (DYSPORT) 500 units SOLR injection DYSPORT 1000 UNITS TO BE ADMINISTERED TO NECK MUSCLES EVERY 3 MONTHS.  Marland Kitchen ELDERBERRY PO Take 2 tablets by mouth daily.  .  famotidine-calcium carbonate-magnesium hydroxide (PEPCID COMPLETE) [phone] MG chewable tablet Chew 1 tablet by mouth daily as needed.   No facility-administered encounter medications on file as of 03/28/2020.    Review of Systems:  Review of Systems  Constitutional: Negative for chills, fever and malaise/fatigue.  HENT: Negative for congestion, hearing loss and sore throat.   Eyes: Negative for blurred vision.  Respiratory: Negative for cough and shortness of breath.   Cardiovascular: Negative for chest pain, palpitations and leg swelling.  Gastrointestinal: Negative for abdominal pain and heartburn.       Gerd better  Genitourinary: Negative for dysuria.  Musculoskeletal: Negative for falls and joint pain.  Skin: Negative for itching and rash.  Neurological: Positive for dizziness. Negative for loss of consciousness.       With decreased carbs and fluid intake decreased while in [place]--trying to rehydrate  Endo/Heme/Allergies: Does not bruise/bleed easily.  Psychiatric/Behavioral: Negative for depression and memory loss. The patient is not nervous/anxious and does not have insomnia.     Health Maintenance  Topic Date Due  . COLONOSCOPY  Never done  . TETANUS/TDAP  07/30/2026 (Originally 12/28/1976)  . INFLUENZA VACCINE  04/14/2020  . COVID-19 Vaccine  Completed  . Hepatitis C Screening  Completed  . HIV Screening  Completed    Physical Exam: Vitals:  03/28/20 0855  BP: 122/82  Pulse: 71  Temp: 97.7 F (36.5 C)  TempSrc: Temporal  SpO2: 98%  Weight: 195 lb (88.5 kg)  Height: 5\' 8"  (1.727 m)   Body mass index is 29.65 kg/m. Physical Exam Vitals reviewed.  Constitutional:      General: He is not in acute distress.    Appearance: Normal appearance. He is not toxic-appearing.  HENT:     Head: Normocephalic and atraumatic.  Cardiovascular:     Rate and Rhythm: Normal rate and regular rhythm.     Pulses: Normal pulses.     Heart sounds: Normal heart sounds.   Pulmonary:     Effort: Pulmonary effort is normal.     Breath sounds: Normal breath sounds. No wheezing, rhonchi or rales.  Abdominal:     General: Bowel sounds are normal.  Musculoskeletal:        General: Normal range of motion.     Right lower leg: No edema.     Left lower leg: No edema.     Comments: Knee tenderness and swelling resolved  Neurological:     General: No focal deficit present.     Mental Status: He is alert and oriented to person, place, and time.  Psychiatric:        Mood and Affect: Mood normal.        Behavior: Behavior normal.        Thought Content: Thought content normal.        Judgment: Judgment normal.     Labs reviewed: Basic Metabolic Panel: Recent Labs    09/25/19 0830 03/25/20 0822  NA 139 137  K 4.4 4.7  CL 101 103  CO2 30 29  GLUCOSE 97 93  BUN 16 20  CREATININE 1.02 1.05  CALCIUM 9.8 9.2   Liver Function Tests: Recent Labs    09/25/19 0830 03/25/20 0822  AST 20 21  ALT 18 21  BILITOT 0.7 0.7  PROT 7.5 6.4   No results for input(s): LIPASE, AMYLASE in the last 8760 hours. No results for input(s): AMMONIA in the last 8760 hours. CBC: Recent Labs    09/25/19 0830 03/25/20 0822  WBC 5.1 5.0  NEUTROABS 3,432 3,455  HGB 15.2 14.6  HCT 45.0 44.0  MCV 90.5 95.0  PLT 472* 276   Lipid Panel: Recent Labs    09/25/19 0830 03/25/20 0822  CHOL 179 170  HDL 49 64  LDLCALC 108* 91  TRIG 109 67  CHOLHDL 3.7 2.7   Lab Results  Component Value Date   HGBA1C 4.9 03/25/2020    Assessment/Plan 1. Overweight with body mass index (BMI) 25.0-29.9 -weight is down 20 lbs, he will try to lose a bit more--he does not want to look skinny though -continue healthier diet, exercise program  2. Body mass index 29.0-29.9, adult -counseled on diet and exercise, he's making good progress  3. Pure hypercholesterolemia -bad cholesterol now at goal with improved eating habits and ongoing exercise program  4. Cervical dystonia  -continues with botox and chiropractor for his neck  5. Gastroesophageal reflux disease without esophagitis -better since using plant-based shakes and eating smaller portions--not needing pepcid so much -still drinks wine that can impact this  Labs/tests ordered:   Lab Orders     CBC with Differential/Platelet     COMPLETE METABOLIC PANEL WITH GFR     Hemoglobin A1c     Lipid panel New cologuard ordered (his expired and he still had not done)  Next appt:  6 mos for CPE, fasting labs before   Kyeisha Janowicz L. Raydin Bielinski, D.O. Geriatrics [facility] [address], [place] [address] Cell Phone (Mon-Fri 8am-5pm):  [phone] On Call:  [phone] & follow prompts after 5pm & weekends Office Phone:  [phone] Office Fax:  [phone]

## 2020-04-18 ENCOUNTER — Telehealth: Payer: Self-pay

## 2020-04-18 NOTE — Telephone Encounter (Addendum)
Fax received from cologuard stating kit delivered to patient and has not been returned. Tried calling patient to ask about his Cologuard. Left voicemail for patient to call office.

## 2020-04-18 NOTE — Telephone Encounter (Deleted)
Left voicemail for patient to call office.

## 2020-04-18 NOTE — Telephone Encounter (Deleted)
Documentation fax received from cologuard stating kit delivered to patient and has not been returned.

## 2020-04-19 NOTE — Telephone Encounter (Signed)
Tried calling patient again with another unsuccessful attempt. Left voicemail for patient to call office.

## 2020-04-19 NOTE — Telephone Encounter (Signed)
Tried calling patient one last time still no answer. Left another voicemail for patient to call office regarding Cologuard test kit.

## 2020-05-27 ENCOUNTER — Telehealth: Payer: Self-pay | Admitting: Neurology

## 2020-05-27 NOTE — Telephone Encounter (Signed)
Patient has Dysport injection on 9/27. I called [facility] and spoke with Rica Mote. He states that the order needs to go through medical benefit review for the month of September. Once patient gives consent for shipment, we will be called to schedule delivery.

## 2020-05-29 NOTE — Telephone Encounter (Signed)
I called [facility] to see if I could schedule delivery. Dysport will be delivered to our office on 9/20.

## 2020-06-03 NOTE — Telephone Encounter (Signed)
(  2) 500U vials of Dysport delivered today from [facility].

## 2020-06-10 ENCOUNTER — Telehealth: Payer: Self-pay | Admitting: Neurology

## 2020-06-10 ENCOUNTER — Ambulatory Visit: Payer: 59 | Admitting: Neurology

## 2020-06-10 ENCOUNTER — Encounter: Payer: Self-pay | Admitting: Neurology

## 2020-06-10 ENCOUNTER — Other Ambulatory Visit: Payer: Self-pay

## 2020-06-10 VITALS — BP 142/78 | HR 72 | Ht 68.0 in | Wt 194.0 lb

## 2020-06-10 DIAGNOSIS — G243 Spasmodic torticollis: Secondary | ICD-10-CM

## 2020-06-10 NOTE — Progress Notes (Signed)
PATIENT: Andrew Bentley DOB: 02/17/1956  HISTORICAL  Andrew Bentley is a 64 years old right-handed Caucasian male, came back for revisit, for potential EMG guided Botox injection for his cervical dystonia  He had a gradual onset neck turning to the left side, abnormal neck posture since 2005, progressively worse since 2008, he was enrolled in Dysport research trial sponsored by [employer] since 2013, he totally received 4 injection, last injection was November 2013, complete the study successfully in April 2014, which is his last office visit.   During the trial, he receive EMG guided dysport injection every 3 months, 500 units, he tolerated the injection very well, showed significant improvement of his neck posturing  He continue to works out regularly, he denies gait difficulty, he complains of left occipital area radiating pain to his left parietal area, he denies radiating pain from his neck to his arms, and hands  UPDATE Jan 20th 2016: He signed a consent form, also documented under communication dated October 03 2014, potential side effect explained, he had a bulky right sternocleidomastoid, left posterior cervical paraspinal muscles.  UPDATE February 13 2015: He responded very well to previous injection in October 03 2014,he received 500 units of Dysport, he can move his neck better, no significant side effect, no neck extension weakness, no swallowing difficulties  He has almost constant head turning to the left side even at the peak benefit of the Dysport injection  UPDATE May 11th 2017: Last injection was in Sept 2016, he received Dysport 500 units times 2, he had transient dysphagia, and dysarthria lasting for couple weeks, otherwise injection was very helpful, he continued to exercise regularly, receiving chiropractic adjustment regularly  Update April 29 2016:  He responded very well to previous injection Dysport 500 unitsx2 in May 2017, did reported side effect of mild transient  swallowing difficulty, weak voice, difficulty extending his neck,  Update September 03 2016, He responded very well to previous injectionsignificant side effect notice,  UPDATE Jan 13 2017: He had returning of frequent neck no no shaking movement, forceful pushing towards the left side, also lean towards his left shoulder, he has right posterior neck pain  Update April 21 2017: Responded well to previous injection, he has significant posterior neck muscle atrophy, did complains of mild hoarse voice after injection  UPDATE Jul 23 2017: He respond well to previous injection, recent onset of low back pain  UPDATE Nov 03 2017: He has no significant side effect from previous injection, complains of left side of the neck muscle tension,  UPDATE Feb 02 2018: He responded very well to previous injection, no significant side effect noticed.  UPDATE May 12 2018: He is doing very well with his injection, workout regularly,  UPDATE Aug 31 2018: He responded very well to previous injection, complains of right posterior neck muscle tension  UPDATE Jan 25 2019: He responded very well to previous injection  UPDATE May 09 2019: He responded well to previous injection  UPDATE Aug 15 2019: He has mild left-sided neck pain,  UPDATE December 06 2019: He did well with previous injection, also receiving massage therapy  Update March 06, 2020: He has significant atrophy of left posterior cervical muscle, is receiving massage therapy, chiropractor regularly which has been helpful,  UPDATE Sept 27 2021: He responded very well to Dysport injection, no significant side effect noticed, along with massage, workout regularly, chiropractor, his symptoms overall has much improved, less muscle tightness, spasm  REVIEW OF SYSTEMS: Full 14 system  review of systems performed and notable only for as above  ALLERGIES: No Known Allergies  HOME MEDICATIONS: Current Outpatient Medications on File Prior to  Visit  Medication Sig Dispense Refill  . AbobotulinumtoxinA (DYSPORT) 500 units SOLR injection DYSPORT 1000 UNITS TO BE ADMINISTERED TO NECK MUSCLES EVERY 3 MONTHS. 2 each 3  . ELDERBERRY PO Take 2 tablets by mouth daily.    . famotidine-calcium carbonate-magnesium hydroxide (PEPCID COMPLETE) [phone] MG chewable tablet Chew 1 tablet by mouth daily as needed.     No current facility-administered medications on file prior to visit.    PAST MEDICAL HISTORY: Past Medical History:  Diagnosis Date  . Cervical dystonia   . Transaminitis     PAST SURGICAL HISTORY: Past Surgical History:  Procedure Laterality Date  . TONSILLECTOMY  1963    FAMILY HISTORY: Family History  Problem Relation Age of Onset  . Bipolar disorder Mother   . Dementia Mother   . Parkinson's disease Mother   . Memory loss Father     SOCIAL HISTORY:  Social History   Socioeconomic History  . Marital status: Married    Spouse name: Not on file  . Number of children: 0  . Years of education: college  . Highest education level: Not on file  Occupational History    Comment: Self Emp.  Tobacco Use  . Smoking status: Never Smoker  . Smokeless tobacco: Never Used  Vaping Use  . Vaping Use: Never used  Substance and Sexual Activity  . Alcohol use: Yes    Alcohol/week: 2.0 standard drinks    Types: 2 Glasses of wine per week    Comment: Red wine with dinner  . Drug use: No  . Sexual activity: Not on file  Other Topics Concern  . 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  [employer]   Education college education.   Right handed.   Caffeine two cups of coffee daily.   2 dogs, 1 cat   Exercises    Social Determinants of Health   Financial Resource Strain:   . Difficulty of Paying Living Expenses: Not on file  Food Insecurity:   . Worried About Programme researcher, broadcasting/film/video in the Last Year: Not  on file  . Ran Out of Food in the Last Year: Not on file  Transportation Needs:   . Lack of Transportation (Medical): Not on file  . Lack of Transportation (Non-Medical): Not on file  Physical Activity:   . Days of Exercise per Week: Not on file  . Minutes of Exercise per Session: Not on file  Stress:   . Feeling of Stress : Not on file  Social Connections:   . Frequency of Communication with Friends and Family: Not on file  . Frequency of Social Gatherings with Friends and Family: Not on file  . Attends Religious Services: Not on file  . Active Member of Clubs or Organizations: Not on file  . Attends Banker Meetings: Not on file  . Marital Status: Not on file  Intimate Partner Violence:   . Fear of Current or Ex-Partner: Not on file  . Emotionally Abused: Not on file  . Physically Abused: Not on file  . Sexually Abused: Not on file     PHYSICAL EXAM   Vitals:   06/10/20 1317  BP: (!) 142/78  Pulse: 72  Weight: 194 lb (88 kg)  Height: 5\' 8"  (1.727 m)   Not recorded     Body mass index is 29.5 kg/m.  DIAGNOSTIC DATA (LABS, IMAGING, TESTING) - I reviewed patient records, labs, notes, testing and imaging myself where available.  ASSESSMENT AND PLAN  Andrew Bentley is a 64 y.o. male with long-standing history of cervical dystonia, responded very well to previous EMG guided dysport injection.  He has mild to moderate left turning, mild retrocollis, mild right laterocollis, mild left shoulder elevation, frequent no-no head shaking, significant atrophy of left posterior neck muscle  Under EMG guidance, 500 x2 =1,000 units of Dysport were injected (500 /2.5 cc for total of 5.0 cc)    Right sternocleidomastoid 0.5 ccx2=1.0 cc  Left inferior oblique capitis 0.5 cc Left posterior scalene 0.5 cc  Left levator scapular 0.5x2= 1.0 cc  Left longissimus capitis 0.5 cc Left splenius capitis 0.5 cc Left splenius cervix 0.5 cc Right splenius cervix 0.5  cc  Return to clinic in 3 months  68, M.D. Ph.D.  [facility] [address], [address] [place], [place] [phone]

## 2020-06-10 NOTE — Progress Notes (Signed)
**  Dysport 500 units x 2 vials, NDC [address]-6, Lot [id number], Exp 01/11/2021, specialty pharmacy.//mck,rn**

## 2020-06-10 NOTE — Telephone Encounter (Signed)
Please change to Dysport 500 units at next injection cycle.

## 2020-06-11 NOTE — Telephone Encounter (Signed)
Noted  

## 2020-06-25 ENCOUNTER — Telehealth: Payer: Self-pay

## 2020-06-25 NOTE — Telephone Encounter (Signed)
I called and spoke with patient about if he had received the Cologuard Kit he said he had and he was out of town at the moment but once he returned he would be using the kit and returning it

## 2020-09-03 ENCOUNTER — Telehealth: Payer: Self-pay | Admitting: Neurology

## 2020-09-03 NOTE — Telephone Encounter (Signed)
Patient has a Dysport injection 09/18/20. I called [facility] and spoke with Lafonda Mosses regarding the order. She states medical benefit review needs to be completed for the month of January, which will take 24 hours. I asked her about decreasing the dosage as Dr. Terrace Arabia had ordered at patient's last appointment. She connected me with Renae Fickle in the pharmacy. He states a new prescription will be needed. 500u every 3 months with 3 refills for G24.3. Renae Fickle states he will work on getting this ready.  I called [employer] to make sure no PA is required. I spoke with Fayrene Fearing to check PA requirements for codes [phone] and [id number]. Fayrene Fearing states no PA is required, but MD can not B/B. Reference [id number].

## 2020-09-16 ENCOUNTER — Telehealth: Payer: Self-pay | Admitting: Neurology

## 2020-09-16 NOTE — Telephone Encounter (Addendum)
Patient had an upcoming appointment for Dysport on 09/18/20. Dr. Terrace Arabia is out this week, but patient ended up cancelling his appointment because he is feeling okay and wants to wait as long as he can between injections. I called the patient and rescheduled him for 2/1. I called [facility] and spoke with Alinda Money regarding scheduling delivery of medication. He then transferred me to [facility] in another department. She states medical benefit review has not been completed for this month. She forwarded it over to that department for review. She states we will be contacted after this is completed.

## 2020-09-17 NOTE — Telephone Encounter (Signed)
I received a fax from [facility] stating that new insurance is needed because patient's plan termed 09/13/20. I called [employer] and spoke with Jonny Ruiz to confirm this. Reference #[id number]. I called the patient to see if he has new coverage. He states he now has BCBS but has not received his information yet. I asked him to call me with this information once received.

## 2020-09-18 ENCOUNTER — Ambulatory Visit: Payer: 59 | Admitting: Neurology

## 2020-09-23 ENCOUNTER — Other Ambulatory Visit: Payer: Self-pay

## 2020-09-23 DIAGNOSIS — E7800 Pure hypercholesterolemia, unspecified: Secondary | ICD-10-CM

## 2020-09-23 DIAGNOSIS — K219 Gastro-esophageal reflux disease without esophagitis: Secondary | ICD-10-CM

## 2020-09-23 DIAGNOSIS — Z6829 Body mass index (BMI) 29.0-29.9, adult: Secondary | ICD-10-CM

## 2020-09-23 DIAGNOSIS — E663 Overweight: Secondary | ICD-10-CM

## 2020-09-24 LAB — COMPLETE METABOLIC PANEL WITH GFR
AG Ratio: 1.7 (calc) (ref 1.0–2.5)
ALT: 16 U/L (ref 9–46)
AST: 18 U/L (ref 10–35)
Albumin: 4.2 g/dL (ref 3.6–5.1)
Alkaline phosphatase (APISO): 48 U/L (ref 35–144)
BUN: 17 mg/dL (ref 7–25)
CO2: 31 mmol/L (ref 20–32)
Calcium: 9.4 mg/dL (ref 8.6–10.3)
Chloride: 100 mmol/L (ref 98–110)
Creat: 1.12 mg/dL (ref 0.70–1.25)
GFR, Est African American: 81 mL/min/{1.73_m2} (ref >=60)
GFR, Est Non African American: 70 mL/min/{1.73_m2} (ref >=60)
Globulin: 2.5 g/dL (calc) (ref 1.9–3.7)
Glucose, Bld: 90 mg/dL (ref 65–99)
Potassium: 4.7 mmol/L (ref 3.5–5.3)
Sodium: 138 mmol/L (ref 135–146)
Total Bilirubin: 0.4 mg/dL (ref 0.2–1.2)
Total Protein: 6.7 g/dL (ref 6.1–8.1)

## 2020-09-24 LAB — LIPID PANEL
Cholesterol: 156 mg/dL (ref <200)
HDL: 53 mg/dL (ref >=40)
LDL Cholesterol (Calc): 89 mg/dL (calc)
Non-HDL Cholesterol (Calc): 103 mg/dL (calc) (ref <130)
Total CHOL/HDL Ratio: 2.9 (calc) (ref <5.0)
Triglycerides: 62 mg/dL (ref <150)

## 2020-09-24 LAB — CBC WITH DIFFERENTIAL/PLATELET
Absolute Monocytes: 292 cells/uL (ref 200–950)
Basophils Absolute: 41 cells/uL (ref 0–200)
Basophils Relative: 1.1 %
Eosinophils Absolute: 100 cells/uL (ref 15–500)
Eosinophils Relative: 2.7 %
HCT: 43 % (ref 38.5–50.0)
Hemoglobin: 14.4 g/dL (ref 13.2–17.1)
Lymphs Abs: 881 cells/uL (ref 850–3900)
MCH: 30.8 pg (ref 27.0–33.0)
MCHC: 33.5 g/dL (ref 32.0–36.0)
MCV: 92.1 fL (ref 80.0–100.0)
MPV: 10.8 fL (ref 7.5–12.5)
Monocytes Relative: 7.9 %
Neutro Abs: 2387 cells/uL (ref 1500–7800)
Neutrophils Relative %: 64.5 %
Platelets: 296 10*3/uL (ref 140–400)
RBC: 4.67 10*6/uL (ref 4.20–5.80)
RDW: 12.3 % (ref 11.0–15.0)
Total Lymphocyte: 23.8 %
WBC: 3.7 10*3/uL — ABNORMAL LOW (ref 3.8–10.8)

## 2020-09-24 LAB — HEMOGLOBIN A1C
Hgb A1c MFr Bld: 5.2 % of total Hgb (ref <5.7)
Mean Plasma Glucose: 103 mg/dL
eAG (mmol/L): 5.7 mmol/L

## 2020-09-24 NOTE — Progress Notes (Signed)
Labs look good overall.

## 2020-09-26 ENCOUNTER — Ambulatory Visit (INDEPENDENT_AMBULATORY_CARE_PROVIDER_SITE_OTHER): Payer: BC Managed Care – PPO | Admitting: Internal Medicine

## 2020-09-26 ENCOUNTER — Other Ambulatory Visit: Payer: Self-pay

## 2020-09-26 ENCOUNTER — Encounter: Payer: Self-pay | Admitting: Internal Medicine

## 2020-09-26 VITALS — BP 130/78 | HR 82 | Temp 98.2°F | Ht 68.0 in | Wt 192.0 lb

## 2020-09-26 DIAGNOSIS — Z789 Other specified health status: Secondary | ICD-10-CM

## 2020-09-26 DIAGNOSIS — G243 Spasmodic torticollis: Secondary | ICD-10-CM | POA: Diagnosis not present

## 2020-09-26 DIAGNOSIS — Z7289 Other problems related to lifestyle: Secondary | ICD-10-CM | POA: Diagnosis not present

## 2020-09-26 DIAGNOSIS — E7800 Pure hypercholesterolemia, unspecified: Secondary | ICD-10-CM | POA: Diagnosis not present

## 2020-09-26 DIAGNOSIS — H9313 Tinnitus, bilateral: Secondary | ICD-10-CM

## 2020-09-26 NOTE — Progress Notes (Signed)
Location:  [facility] clinic Provider:  Tahjae Durr L. Renato Gails, D.O., C.M.D.  Goals of Care:  Advanced Directives 09/26/2020  Does Patient Have a Medical Advance Directive? No  Does patient want to make changes to medical advance directive? -  Would patient like information on creating a medical advance directive? Yes (MAU/Ambulatory/Procedural Areas - Information given)   Chief Complaint  Patient presents with  . Medical Management of Chronic Issues    6 month folllow-up and review labs. Discuss need for colonoscopy. Patient refused covid and flu vaccine recommendation. Discuss if patient can do anything to help avoid memory issues     HPI: Patient is a 65 y.o. male seen today for medical management of chronic diseases.    He was sick this time last week.  Had one day of congestion and then symptoms went away.  Friday had temp 102, sat and sun felt fine.  Has a little bit of sinus issues.    Due 2/1 for dysport with Dr. Loura Pardon dose and goes every 3 mos.   His dad is not driving anymore after syncopal episode (suspected).  Caregiver is trying him into work.  He had no recollection of the events.  He still follows the stock market.    He's down to 192 lbs now.  He wants to stay right around there.    He still has not done the cologuard.    He is eating better.  He eats a lot of protein and veggies and wife is also doing that.  He ate "some crap" over the holidays.  Wine intake has not changed.    Protein powder has helped indigestion.  No longer needing pepcid.  Has bilateral tinnitus and it's irritating--it's a low ring.  He plans to see audiology.  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    Counseled about importance of advance care planning documents.  He needs to find time to do this.  Past Medical History:  Diagnosis Date  . Cervical dystonia   . Transaminitis    Past Surgical History:  Procedure Laterality Date  . TONSILLECTOMY  1963   No Known Allergies  Outpatient  Encounter Medications as of 09/26/2020  Medication Sig  . AbobotulinumtoxinA (DYSPORT) 500 units SOLR injection DYSPORT 1000 UNITS TO BE ADMINISTERED TO NECK MUSCLES EVERY 3 MONTHS.  Marland Kitchen ELDERBERRY PO Take 2 tablets by mouth daily.  . famotidine-calcium carbonate-magnesium hydroxide (PEPCID COMPLETE) [phone] MG chewable tablet Chew 1 tablet by mouth daily as needed.  . Protein POWD Take 1 Container by mouth daily. Shake in the morning   No facility-administered encounter medications on file as of 09/26/2020.    Review of Systems:  Review of Systems  Constitutional: Negative for chills, fever and malaise/fatigue.  HENT: Positive for tinnitus. Negative for congestion, hearing loss and sore throat.   Eyes: Negative for blurred vision.  Respiratory: Negative for cough and shortness of breath.   Cardiovascular: Negative for chest pain, palpitations and leg swelling.  Gastrointestinal: Negative for abdominal pain, blood in stool, constipation, diarrhea, heartburn, melena, nausea and vomiting.  Genitourinary: Negative for dysuria, frequency and urgency.  Musculoskeletal: Positive for myalgias and neck pain. Negative for falls.  Neurological: Negative for dizziness and loss of consciousness.  Endo/Heme/Allergies: Does not bruise/bleed easily.  Psychiatric/Behavioral: Negative for depression and memory loss. The patient is not nervous/anxious and does not have insomnia.     Health Maintenance  Topic Date Due  . COLONOSCOPY (Pts 45-22yrs Insurance coverage will need to be  confirmed)  Never done  . COVID-19 Vaccine (2 - Booster for Janssen series) 10/12/2020 (Originally 02/08/2020)  . INFLUENZA VACCINE  12/12/2020 (Originally 04/14/2020)  . TETANUS/TDAP  07/30/2026 (Originally 12/28/1976)  . Hepatitis C Screening  Completed  . HIV Screening  Completed    Physical Exam: Vitals:   09/26/20 0939  BP: 130/78  Pulse: 82  Temp: 98.2 F (36.8 C)  TempSrc: Temporal  SpO2: 98%  Weight: 192 lb  (87.1 kg)  Height: 5\' 8"  (1.727 m)   Body mass index is 29.19 kg/m. Physical Exam Vitals reviewed.  Constitutional:      General: He is not in acute distress.    Appearance: Normal appearance. He is not toxic-appearing.  HENT:     Head: Normocephalic and atraumatic.  Cardiovascular:     Rate and Rhythm: Normal rate and regular rhythm.     Pulses: Normal pulses.     Heart sounds: Normal heart sounds.  Pulmonary:     Effort: Pulmonary effort is normal.     Breath sounds: Normal breath sounds. No wheezing, rhonchi or rales.  Abdominal:     General: Bowel sounds are normal.  Musculoskeletal:     Cervical back: Neck supple.     Comments: Some neck spasticity  Lymphadenopathy:     Cervical: No cervical adenopathy.  Skin:    General: Skin is warm and dry.  Neurological:     General: No focal deficit present.     Mental Status: He is alert and oriented to person, place, and time.  Psychiatric:        Mood and Affect: Mood normal.        Behavior: Behavior normal.        Thought Content: Thought content normal.        Judgment: Judgment normal.     Labs reviewed: Basic Metabolic Panel: Recent Labs    03/25/20 0822 09/23/20 0907  NA 137 138  K 4.7 4.7  CL 103 100  CO2 29 31  GLUCOSE 93 90  BUN 20 17  CREATININE 1.05 1.12  CALCIUM 9.2 9.4   Liver Function Tests: Recent Labs    03/25/20 0822 09/23/20 0907  AST 21 18  ALT 21 16  BILITOT 0.7 0.4  PROT 6.4 6.7   No results for input(s): LIPASE, AMYLASE in the last 8760 hours. No results for input(s): AMMONIA in the last 8760 hours. CBC: Recent Labs    03/25/20 0822 09/23/20 0907  WBC 5.0 3.7*  NEUTROABS 3,455 2,387  HGB 14.6 14.4  HCT 44.0 43.0  MCV 95.0 92.1  PLT 276 296   Lipid Panel: Recent Labs    03/25/20 0822 09/23/20 0907  CHOL 170 156  HDL 64 53  LDLCALC 91 89  TRIG 67 62  CHOLHDL 2.7 2.9   Lab Results  Component Value Date   HGBA1C 5.2 09/23/2020    Procedures since last  visit: No results found.  Assessment/Plan 1. Cervical dystonia -he's going to ask Dr. 11/21/2020 about new alternative botox type for his injections when he goes next month   2. Pure hypercholesterolemia - cont to work on diet and exercise regimen  -LDL at goal - Lipid panel; Future  3. Alcohol use -ongoing wine intake above adult recommendation--has been counseled  - BASIC METABOLIC PANEL WITH GFR; Future  4. Tinnitus of both ears -agree with plan to see audiology to eval  REMINDED AGAIN ABOUT COLOGUARD IMPORTANCE OF COMPLETION FOR COLON CANCER SCREENING.    Labs/tests  ordered:   Lab Orders     Lipid panel     BASIC METABOLIC PANEL WITH GFR  Next appt:  6 mos med mgt   Danett Palazzo L. Venisa Frampton, D.O. Geriatrics [facility] [address] [place], [place] [address] Cell Phone (Mon-Fri 8am-5pm):  [phone] On Call:  [phone] & follow prompts after 5pm & weekends Office Phone:  [phone] Office Fax:  [phone]

## 2020-09-26 NOTE — Patient Instructions (Signed)
Please do your colon cancer screening with cologuard.

## 2020-10-08 NOTE — Telephone Encounter (Addendum)
Patient has new insurance through [employer]. I filled out [employer] PA for Dysport 500U and will give to MD to sign.

## 2020-10-10 NOTE — Telephone Encounter (Signed)
Received [employer] approval via fax. Reference #[id number] (10/09/20- 10/08/21). Patient is B/B for this visit due to receiving new insurance information too close to appointment. Not enough time allotted to start patient with a new specialty pharmacy. After 2/1 appointment, I will fill out a new PA for [employer] to enroll with a specialty pharmacy.

## 2020-10-15 ENCOUNTER — Encounter: Payer: Self-pay | Admitting: Neurology

## 2020-10-15 ENCOUNTER — Ambulatory Visit: Payer: BC Managed Care – PPO | Admitting: Neurology

## 2020-10-15 VITALS — BP 129/79 | HR 74 | Ht 68.0 in | Wt 192.0 lb

## 2020-10-15 DIAGNOSIS — G243 Spasmodic torticollis: Secondary | ICD-10-CM | POA: Diagnosis not present

## 2020-10-15 MED ORDER — ABOBOTULINUMTOXINA 500 UNITS IM SOLR
500.0000 [IU] | Freq: Once | INTRAMUSCULAR | Status: AC
  Administered 2020-10-15: 500 [IU] via INTRAMUSCULAR

## 2020-10-15 NOTE — Progress Notes (Signed)
**  Dysport 500 units x 1 vial, NDC [address]-8, Lot [id number], Exp 06/13/2021, office supply.//mck,rn**

## 2020-10-15 NOTE — Progress Notes (Signed)
PATIENT: Andrew Bentley DOB: 11/20/1955  HISTORICAL  Andrew Bentley is a 65 years old right-handed Caucasian male, came back for revisit, for potential EMG guided Botox injection for his cervical dystonia  He had a gradual onset neck turning to the left side, abnormal neck posture since 2005, progressively worse since 2008, he was enrolled in Dysport research trial sponsored by [employer] since 2013, he totally received 4 injection, last injection was November 2013, complete the study successfully in April 2014, which is his last office visit.   During the trial, he receive EMG guided dysport injection every 3 months, 500 units, he tolerated the injection very well, showed significant improvement of his neck posturing  He continue to works out regularly, he denies gait difficulty, he complains of left occipital area radiating pain to his left parietal area, he denies radiating pain from his neck to his arms, and hands  UPDATE Jan 20th 2016: He signed a consent form, also documented under communication dated October 03 2014, potential side effect explained, he had a bulky right sternocleidomastoid, left posterior cervical paraspinal muscles.  UPDATE February 13 2015: He responded very well to previous injection in October 03 2014,he received 500 units of Dysport, he can move his neck better, no significant side effect, no neck extension weakness, no swallowing difficulties  He has almost constant head turning to the left side even at the peak benefit of the Dysport injection  UPDATE May 11th 2017: Last injection was in Sept 2016, he received Dysport 500 units times 2, he had transient dysphagia, and dysarthria lasting for couple weeks, otherwise injection was very helpful, he continued to exercise regularly, receiving chiropractic adjustment regularly  Update April 29 2016:  He responded very well to previous injection Dysport 500 unitsx2 in May 2017, did reported side effect of mild transient  swallowing difficulty, weak voice, difficulty extending his neck,  Update September 03 2016, He responded very well to previous injectionsignificant side effect notice,  UPDATE Jan 13 2017: He had returning of frequent neck no no shaking movement, forceful pushing towards the left side, also lean towards his left shoulder, he has right posterior neck pain  Update April 21 2017: Responded well to previous injection, he has significant posterior neck muscle atrophy, did complains of mild hoarse voice after injection  UPDATE Jul 23 2017: He respond well to previous injection, recent onset of low back pain  UPDATE Nov 03 2017: He has no significant side effect from previous injection, complains of left side of the neck muscle tension,  UPDATE Feb 02 2018: He responded very well to previous injection, no significant side effect noticed.  UPDATE May 12 2018: He is doing very well with his injection, workout regularly,  UPDATE Aug 31 2018: He responded very well to previous injection, complains of right posterior neck muscle tension  UPDATE Jan 25 2019: He responded very well to previous injection  UPDATE May 09 2019: He responded well to previous injection  UPDATE Aug 15 2019: He has mild left-sided neck pain,  UPDATE December 06 2019: He did well with previous injection, also receiving massage therapy  Update March 06, 2020: He has significant atrophy of left posterior cervical muscle, is receiving massage therapy, chiropractor regularly which has been helpful,  UPDATE Sept 27 2021: He responded very well to Dysport injection, no significant side effect noticed, along with massage, workout regularly, chiropractor, his symptoms overall has much improved, less muscle tightness, spasm  Update October 15, 2020: This is  4 months from previous injection, only recently he noticed left posterior neck muscle tightness, improved by stretching exercise, and physical therapy, we decided to  decrease his Dysport dose from 1000 to  500 units today REVIEW OF SYSTEMS: Full 14 system review of systems performed and notable only for as above  ALLERGIES: No Known Allergies  HOME MEDICATIONS: Current Outpatient Medications on File Prior to Visit  Medication Sig Dispense Refill  . AbobotulinumtoxinA (DYSPORT) 500 units SOLR injection DYSPORT 1000 UNITS TO BE ADMINISTERED TO NECK MUSCLES EVERY 3 MONTHS. 2 each 3  . ELDERBERRY PO Take 2 tablets by mouth daily.    . famotidine-calcium carbonate-magnesium hydroxide (PEPCID COMPLETE) [phone] MG chewable tablet Chew 1 tablet by mouth daily as needed.    . Protein POWD Take 1 Container by mouth daily. Shake in the morning     No current facility-administered medications on file prior to visit.    PAST MEDICAL HISTORY: Past Medical History:  Diagnosis Date  . Cervical dystonia   . Transaminitis     PAST SURGICAL HISTORY: Past Surgical History:  Procedure Laterality Date  . TONSILLECTOMY  1963    FAMILY HISTORY: Family History  Problem Relation Age of Onset  . Bipolar disorder Mother   . Dementia Mother   . Parkinson's disease Mother   . Memory loss Father     SOCIAL HISTORY:  Social History   Socioeconomic History  . Marital status: Married    Spouse name: Not on file  . Number of children: 0  . Years of education: college  . Highest education level: Not on file  Occupational History    Comment: Self Emp.  Tobacco Use  . Smoking status: Never Smoker  . Smokeless tobacco: Never Used  Vaping Use  . Vaping Use: Never used  Substance and Sexual Activity  . Alcohol use: Yes    Alcohol/week: 2.0 standard drinks    Types: 2 Glasses of wine per week    Comment: Red wine with dinner  . Drug use: No  . Sexual activity: Not on file  Other Topics Concern  . Not on file  Social History Narrative   Patient lives at home (one stories) with his wife [name], married since 1985    Patient works full time Market researcher.   [employer]   Education college education.   Right handed.   Caffeine two cups of coffee daily.   2 dogs, 1 cat   Exercises    Social Determinants of Health   Financial Resource Strain: Not on file  Food Insecurity: Not on file  Transportation Needs: Not on file  Physical Activity: Not on file  Stress: Not on file  Social Connections: Not on file  Intimate Partner Violence: Not on file     PHYSICAL EXAM   Vitals:   10/15/20 1310  BP: 129/79  Pulse: 74  Weight: 192 lb (87.1 kg)  Height: 5\' 8"  (1.727 m)   Not recorded     Body mass index is 29.19 kg/m.  DIAGNOSTIC DATA (LABS, IMAGING, TESTING) - I reviewed patient records, labs, notes, testing and imaging myself where available.  ASSESSMENT AND PLAN  Andrew Bentley is a 65 y.o. male with long-standing history of cervical dystonia, responded very well to previous EMG guided dysport injection.  He has mild to moderate left turning, mild retrocollis, mild right laterocollis, mild left shoulder elevation, frequent no-no head shaking, significant atrophy of left posterior neck muscle  Under EMG guidance, 500  units  of Dysport were injected (500 /2.5 cc for total of 5.0 cc)    Right sternocleidomastoid 0.5 cc   Left longissimus capitis 0.5 cc Left splenius capitis 0.5 cc Left splenius cervix 0.5 cc Left semispinalis 0.5 units  Return to clinic in 3 months  Levert Feinstein, M.D. Ph.D.  [facility] [address], [address] [place], [place] [address] [phone]

## 2020-10-22 NOTE — Telephone Encounter (Signed)
Filled out [employer] PA form only to update specialty pharmacy. Patient will now need to use [facility] due to [facility] not being in network with [employer]. I will fill out enrollment form for [facility].

## 2020-10-31 NOTE — Telephone Encounter (Signed)
Faxed enrollment form to [facility] with [employer] approval letter.

## 2020-10-31 NOTE — Telephone Encounter (Signed)
I called the patient to notify him of the change in specialty pharmacy. I told the patient that I faxed in the prescription for Dysport today. Advised that the pharmacy will process it and call him for shipment consent within a week or two. I provided patient with the pharmacy number. Pt verbalized understanding.

## 2020-11-04 ENCOUNTER — Encounter: Payer: Self-pay | Admitting: Internal Medicine

## 2020-11-06 NOTE — Telephone Encounter (Signed)
I called [facility] and spoke with Manny to check the order status. Andrew Bentley states that the order is ready for scheduling but the patient needs to provide consent. Manny placed me on hold and called the patient. He was able to reach him and obtain consent. Dysport TBD 3/1.

## 2021-01-09 NOTE — Telephone Encounter (Signed)
Patient has a Dysport injection on 5/4. The Dysport never arrived to our office as scheduled on 3/1. I called [facility] and spoke to Casimiro Needle to reschedule delivery for 5/3.

## 2021-01-15 ENCOUNTER — Ambulatory Visit: Payer: BC Managed Care – PPO | Admitting: Neurology

## 2021-01-15 NOTE — Telephone Encounter (Signed)
Patient has not seen my message on mychart from earlier today, so I called patient and discussed the situation, advised that I will be in contact with the pharmacy this afternoon to pin down the issue with delivering the medication. Let him know that I will call him back either later this afternoon or in the morning. Patient verbalized understanding & is aware appt for this afternoon has been cancelled.

## 2021-01-15 NOTE — Telephone Encounter (Signed)
Dysport was not delivered yesterday as scheduled. Patient Is scheduled to have an appointment today at 3:00.

## 2021-01-16 NOTE — Telephone Encounter (Signed)
I called [facility] and spoke with Marchelle Folks. Marchelle Folks states that the order was stopped due to needing a PA. I provided this information again [facility], reference #[id number] 10/09/20- 10/08/21). We were able to reschedule the delivery for 5/10.

## 2021-01-17 ENCOUNTER — Telehealth: Payer: Self-pay | Admitting: Neurology

## 2021-01-17 NOTE — Telephone Encounter (Signed)
Pt is needing to change his Dysport appt due to being out of town. Please advise.

## 2021-01-20 NOTE — Telephone Encounter (Signed)
I have contacted the patient. Waiting for a response back.

## 2021-01-22 NOTE — Telephone Encounter (Signed)
Pt called, need to reschedule my  Dysport appt due to being out of town. Would like a call from the nurse

## 2021-01-23 ENCOUNTER — Ambulatory Visit: Payer: BC Managed Care – PPO | Admitting: Neurology

## 2021-01-23 NOTE — Telephone Encounter (Signed)
Called patient and rescheduled to June 1st at 11:00.

## 2021-01-27 NOTE — Telephone Encounter (Signed)
I called [facility] [phone]) and spoke with Alla German to check status of order as it was not delivered on 5/10. He states a new prescription for Dysport is needed. I spoke with Carollee Herter in the pharmacy to provide this. (Dysport 500 units x1 vial).

## 2021-01-29 NOTE — Telephone Encounter (Signed)
I called [facility] and spoke with Marylene Land to check order status. Marylene Land states that it is still in prescription verification. I let her know that we have had continuous issues with this prescription and we would really like this to be fixed as soon as possible. Marylene Land states she sent a note to the pharmacy to get the order pushed through.

## 2021-02-03 NOTE — Telephone Encounter (Signed)
Andrew Bentley with [facility] called to schedule Dysport delivery. Dysport TBD 5/25.

## 2021-02-05 DIAGNOSIS — G243 Spasmodic torticollis: Secondary | ICD-10-CM | POA: Diagnosis not present

## 2021-02-05 NOTE — Telephone Encounter (Signed)
Received (1) 500 unit vial of Botox today from [facility].

## 2021-02-11 ENCOUNTER — Ambulatory Visit: Payer: BC Managed Care – PPO | Admitting: Neurology

## 2021-02-12 ENCOUNTER — Ambulatory Visit: Payer: BC Managed Care – PPO | Admitting: Neurology

## 2021-02-12 ENCOUNTER — Encounter: Payer: Self-pay | Admitting: Neurology

## 2021-02-12 VITALS — BP 138/80 | HR 69 | Ht 68.0 in | Wt 195.5 lb

## 2021-02-12 DIAGNOSIS — G243 Spasmodic torticollis: Secondary | ICD-10-CM

## 2021-02-12 NOTE — Progress Notes (Signed)
PATIENT: Andrew Bentley DOB: 11/20/1955  HISTORICAL  Andrew Bentley is a 65 years old right-handed Caucasian male, came back for revisit, for potential EMG guided Botox injection for his cervical dystonia  He had a gradual onset neck turning to the left side, abnormal neck posture since 2005, progressively worse since 2008, he was enrolled in Dysport research trial sponsored by [employer] since 2013, he totally received 4 injection, last injection was November 2013, complete the study successfully in April 2014, which is his last office visit.   During the trial, he receive EMG guided dysport injection every 3 months, 500 units, he tolerated the injection very well, showed significant improvement of his neck posturing  He continue to works out regularly, he denies gait difficulty, he complains of left occipital area radiating pain to his left parietal area, he denies radiating pain from his neck to his arms, and hands  UPDATE Jan 20th 2016: He signed a consent form, also documented under communication dated October 03 2014, potential side effect explained, he had a bulky right sternocleidomastoid, left posterior cervical paraspinal muscles.  UPDATE February 13 2015: He responded very well to previous injection in October 03 2014,he received 500 units of Dysport, he can move his neck better, no significant side effect, no neck extension weakness, no swallowing difficulties  He has almost constant head turning to the left side even at the peak benefit of the Dysport injection  UPDATE May 11th 2017: Last injection was in Sept 2016, he received Dysport 500 units times 2, he had transient dysphagia, and dysarthria lasting for couple weeks, otherwise injection was very helpful, he continued to exercise regularly, receiving chiropractic adjustment regularly  Update April 29 2016:  He responded very well to previous injection Dysport 500 unitsx2 in May 2017, did reported side effect of mild transient  swallowing difficulty, weak voice, difficulty extending his neck,  Update September 03 2016, He responded very well to previous injectionsignificant side effect notice,  UPDATE Jan 13 2017: He had returning of frequent neck no no shaking movement, forceful pushing towards the left side, also lean towards his left shoulder, he has right posterior neck pain  Update April 21 2017: Responded well to previous injection, he has significant posterior neck muscle atrophy, did complains of mild hoarse voice after injection  UPDATE Jul 23 2017: He respond well to previous injection, recent onset of low back pain  UPDATE Nov 03 2017: He has no significant side effect from previous injection, complains of left side of the neck muscle tension,  UPDATE Feb 02 2018: He responded very well to previous injection, no significant side effect noticed.  UPDATE May 12 2018: He is doing very well with his injection, workout regularly,  UPDATE Aug 31 2018: He responded very well to previous injection, complains of right posterior neck muscle tension  UPDATE Jan 25 2019: He responded very well to previous injection  UPDATE May 09 2019: He responded well to previous injection  UPDATE Aug 15 2019: He has mild left-sided neck pain,  UPDATE December 06 2019: He did well with previous injection, also receiving massage therapy  Update March 06, 2020: He has significant atrophy of left posterior cervical muscle, is receiving massage therapy, chiropractor regularly which has been helpful,  UPDATE Sept 27 2021: He responded very well to Dysport injection, no significant side effect noticed, along with massage, workout regularly, chiropractor, his symptoms overall has much improved, less muscle tightness, spasm  Update October 15, 2020: This is  4 months from previous injection, only recently he noticed left posterior neck muscle tightness, improved by stretching exercise, and physical therapy, we decided to  decrease his Dysport dose from 1000 to  500 units today  Update February 12, 2021: He responded well to previous Dysport injection, 500 units only, tolerating it well, like lower dose,  REVIEW OF SYSTEMS: Full 14 system review of systems performed and notable only for as above  ALLERGIES: No Known Allergies  HOME MEDICATIONS: Current Outpatient Medications on File Prior to Visit  Medication Sig Dispense Refill  . AbobotulinumtoxinA (DYSPORT) 500 units SOLR injection DYSPORT 1000 UNITS TO BE ADMINISTERED TO NECK MUSCLES EVERY 3 MONTHS. 2 each 3  . ELDERBERRY PO Take 2 tablets by mouth daily.    . famotidine-calcium carbonate-magnesium hydroxide (PEPCID COMPLETE) [phone] MG chewable tablet Chew 1 tablet by mouth daily as needed.    . Protein POWD Take 1 Container by mouth daily. Shake in the morning     No current facility-administered medications on file prior to visit.    PAST MEDICAL HISTORY: Past Medical History:  Diagnosis Date  . Cervical dystonia   . Transaminitis     PAST SURGICAL HISTORY: Past Surgical History:  Procedure Laterality Date  . TONSILLECTOMY  1963    FAMILY HISTORY: Family History  Problem Relation Age of Onset  . Bipolar disorder Mother   . Dementia Mother   . Parkinson's disease Mother   . Memory loss Father     SOCIAL HISTORY:  Social History   Socioeconomic History  . Marital status: Married    Spouse name: Not on file  . Number of children: 0  . Years of education: college  . Highest education level: Not on file  Occupational History    Comment: Self Emp.  Tobacco Use  . Smoking status: Never Smoker  . Smokeless tobacco: Never Used  Vaping Use  . Vaping Use: Never used  Substance and Sexual Activity  . Alcohol use: Yes    Alcohol/week: 2.0 standard drinks    Types: 2 Glasses of wine per week    Comment: Red wine with dinner  . Drug use: No  . Sexual activity: Not on file  Other Topics Concern  . Not on file  Social History  Narrative   Patient lives at home (one stories) with his wife Andrew Bentley, married since 1985    Patient works full time Market researcher.  [employer]   Education college education.   Right handed.   Caffeine two cups of coffee daily.   2 dogs, 1 cat   Exercises    Social Determinants of Health   Financial Resource Strain: Not on file  Food Insecurity: Not on file  Transportation Needs: Not on file  Physical Activity: Not on file  Stress: Not on file  Social Connections: Not on file  Intimate Partner Violence: Not on file     PHYSICAL EXAM   Vitals:   02/12/21 1121  BP: 138/80  Pulse: 69  Weight: 195 lb 8 oz (88.7 kg)  Height: 5\' 8"  (1.727 m)   Not recorded     Body mass index is 29.73 kg/m.  DIAGNOSTIC DATA (LABS, IMAGING, TESTING) - I reviewed patient records, labs, notes, testing and imaging myself where available.  ASSESSMENT AND PLAN  Brando Taves is a 65 y.o. male with long-standing history of cervical dystonia, responded very well to previous EMG guided dysport injection.  He has mild to moderate left turning, mild retrocollis, mild  right laterocollis, frequent small amplitude no-no head shaking, significant atrophy of left posterior neck muscle  Under EMG guidance, 500  units of Dysport were injected (500 /2.5 cc for total of 5.0 cc)    Right sternocleidomastoid 0.5 cc   Left longissimus capitis 0.5 cc Left splenius capitis 0.5 cc Left splenius cervix 0.5 cc Left semispinalis 0.5 units  Return to clinic in 3 months  Levert Feinstein, M.D. Ph.D.  [facility] [address] [place], [place] [address] [phone]

## 2021-02-12 NOTE — Progress Notes (Signed)
**  Dysport 500 units x 1 vial, NDC [address]-3, Lot [id number], Exp 08/13/2021, specialty pharmacy.//mck,rn**

## 2021-03-31 ENCOUNTER — Other Ambulatory Visit: Payer: BC Managed Care – PPO

## 2021-04-03 ENCOUNTER — Ambulatory Visit: Payer: BC Managed Care – PPO | Admitting: Family

## 2021-04-03 ENCOUNTER — Ambulatory Visit: Payer: BC Managed Care – PPO | Admitting: Internal Medicine

## 2021-05-08 ENCOUNTER — Telehealth: Payer: Self-pay | Admitting: Neurology

## 2021-05-08 DIAGNOSIS — G243 Spasmodic torticollis: Secondary | ICD-10-CM | POA: Diagnosis not present

## 2021-05-08 NOTE — Telephone Encounter (Signed)
Received (1) 500 unit vial of Dysport today from [facility] for patient's September injection.

## 2021-05-21 ENCOUNTER — Other Ambulatory Visit: Payer: Self-pay

## 2021-05-21 ENCOUNTER — Ambulatory Visit: Payer: BC Managed Care – PPO | Admitting: Neurology

## 2021-05-21 ENCOUNTER — Encounter: Payer: Self-pay | Admitting: Neurology

## 2021-05-21 VITALS — BP 126/80 | HR 76 | Ht 68.0 in | Wt 199.5 lb

## 2021-05-21 DIAGNOSIS — G243 Spasmodic torticollis: Secondary | ICD-10-CM | POA: Diagnosis not present

## 2021-05-21 NOTE — Progress Notes (Signed)
**  Dysport 500 units x 1 vial, NDC 15054-500-1, YBW#[id number], Exp 12/12/2021, specialty pharmacy.//mck,rn**

## 2021-05-21 NOTE — Progress Notes (Signed)
PATIENT: Andrew Bentley DOB: 11/20/1955  HISTORICAL  Andrew Bentley is a 65 years old right-handed Caucasian male, came back for revisit, for potential EMG guided Botox injection for his cervical dystonia  He had a gradual onset neck turning to the left side, abnormal neck posture since 2005, progressively worse since 2008, he was enrolled in Dysport research trial sponsored by [employer] since 2013, he totally received 4 injection, last injection was November 2013, complete the study successfully in April 2014, which is his last office visit.   During the trial, he receive EMG guided dysport injection every 3 months, 500 units, he tolerated the injection very well, showed significant improvement of his neck posturing  He continue to works out regularly, he denies gait difficulty, he complains of left occipital area radiating pain to his left parietal area, he denies radiating pain from his neck to his arms, and hands  UPDATE Jan 20th 2016: He signed a consent form, also documented under communication dated October 03 2014, potential side effect explained, he had a bulky right sternocleidomastoid, left posterior cervical paraspinal muscles.  UPDATE February 13 2015: He responded very well to previous injection in October 03 2014,he received 500 units of Dysport, he can move his neck better, no significant side effect, no neck extension weakness, no swallowing difficulties  He has almost constant head turning to the left side even at the peak benefit of the Dysport injection  UPDATE May 11th 2017: Last injection was in Sept 2016, he received Dysport 500 units times 2, he had transient dysphagia, and dysarthria lasting for couple weeks, otherwise injection was very helpful, he continued to exercise regularly, receiving chiropractic adjustment regularly  Update April 29 2016:  He responded very well to previous injection Dysport 500 unitsx2 in May 2017, did reported side effect of mild transient  swallowing difficulty, weak voice, difficulty extending his neck,  Update September 03 2016, He responded very well to previous injectionsignificant side effect notice,  UPDATE Jan 13 2017: He had returning of frequent neck no no shaking movement, forceful pushing towards the left side, also lean towards his left shoulder, he has right posterior neck pain  Update April 21 2017: Responded well to previous injection, he has significant posterior neck muscle atrophy, did complains of mild hoarse voice after injection  UPDATE Jul 23 2017: He respond well to previous injection, recent onset of low back pain  UPDATE Nov 03 2017: He has no significant side effect from previous injection, complains of left side of the neck muscle tension,  UPDATE Feb 02 2018: He responded very well to previous injection, no significant side effect noticed.  UPDATE May 12 2018: He is doing very well with his injection, workout regularly,  UPDATE Aug 31 2018: He responded very well to previous injection, complains of right posterior neck muscle tension  UPDATE Jan 25 2019: He responded very well to previous injection  UPDATE May 09 2019: He responded well to previous injection  UPDATE Aug 15 2019: He has mild left-sided neck pain,  UPDATE December 06 2019: He did well with previous injection, also receiving massage therapy  Update March 06, 2020: He has significant atrophy of left posterior cervical muscle, is receiving massage therapy, chiropractor regularly which has been helpful,  UPDATE Sept 27 2021: He responded very well to Dysport injection, no significant side effect noticed, along with massage, workout regularly, chiropractor, his symptoms overall has much improved, less muscle tightness, spasm  Update October 15, 2020: This is  4 months from previous injection, only recently he noticed left posterior neck muscle tightness, improved by stretching exercise, and physical therapy, we decided to  decrease his Dysport dose from 1000 to  500 units today  Update February 12, 2021: He responded well to previous Dysport injection, 500 units only, tolerating it well, like lower dose,  UPDATE Sept 7 2022: He responded well to previous injection, no significant side effect noted  REVIEW OF SYSTEMS: Full 14 system review of systems performed and notable only for as above  ALLERGIES: No Known Allergies  HOME MEDICATIONS: Current Outpatient Medications on File Prior to Visit  Medication Sig Dispense Refill   AbobotulinumtoxinA (DYSPORT) 500 units SOLR injection DYSPORT 1000 UNITS TO BE ADMINISTERED TO NECK MUSCLES EVERY 3 MONTHS. 2 each 3   ELDERBERRY PO Take 2 tablets by mouth daily.     famotidine-calcium carbonate-magnesium hydroxide (PEPCID COMPLETE) [phone] MG chewable tablet Chew 1 tablet by mouth daily as needed.     Protein POWD Take 1 Container by mouth daily. Shake in the morning     No current facility-administered medications on file prior to visit.    PAST MEDICAL HISTORY: Past Medical History:  Diagnosis Date   Cervical dystonia    Transaminitis     PAST SURGICAL HISTORY: Past Surgical History:  Procedure Laterality Date   TONSILLECTOMY  1963    FAMILY HISTORY: Family History  Problem Relation Age of Onset   Bipolar disorder Mother    Dementia Mother    Parkinson's disease Mother    Memory loss Father     SOCIAL HISTORY:  Social History   Socioeconomic History   Marital status: Married    Spouse name: Not on file   Number of children: 0   Years of education: college   Highest education level: Not on file  Occupational History    Comment: Self [employer].  Tobacco Use   Smoking status: Never   Smokeless tobacco: Never  Vaping Use   Vaping Use: Never used  Substance and Sexual Activity   Alcohol use: Yes    Alcohol/week: 2.0 standard drinks    Types: 2 Glasses of wine per week    Comment: Red wine with dinner   Drug use: No   Sexual activity: Not  on file  Other Topics Concern   Not on file  Social History Narrative   Patient lives at home (one stories) with his wife Andrew Bentley, married since 1985    Patient works full time Market researcher.  [employer]   Education college education.   Right handed.   Caffeine two cups of coffee daily.   2 dogs, 1 cat   Exercises    Social Determinants of Health   Financial Resource Strain: Not on file  Food Insecurity: Not on file  Transportation Needs: Not on file  Physical Activity: Not on file  Stress: Not on file  Social Connections: Not on file  Intimate Partner Violence: Not on file     PHYSICAL EXAM   Vitals:   05/21/21 1348  Height: 5\' 8"  (1.727 m)   Not recorded     Body mass index is 29.73 kg/m.  DIAGNOSTIC DATA (LABS, IMAGING, TESTING) - I reviewed patient records, labs, notes, testing and imaging myself where available.  ASSESSMENT AND PLAN  Andrew Bentley is a 65 y.o. male with long-standing history of cervical dystonia, responded very well to previous EMG guided dysport injection.  He has moderate left turning, mild retrocollis, mild left shoulder  elevation, mild right laterocollis, frequent small amplitude no-no head shaking, significant atrophy of left posterior neck muscle  Under EMG guidance, 500  units of Dysport were injected (500 /2.5 cc for total of 5.0 cc)  Right semispinalis 0.5 cc Left longissimus capitis 0.5 cc Left splenius capitis 0.5 cc Left splenius cervix 0.5 cc Left semispinalis 0.5 units  Return to clinic in 3 months  Levert Feinstein, M.D. Ph.D.  [facility] [address] [place], [place] [address] [phone]

## 2021-07-29 ENCOUNTER — Ambulatory Visit: Payer: BC Managed Care – PPO | Admitting: Family

## 2021-08-20 ENCOUNTER — Ambulatory Visit: Payer: BC Managed Care – PPO | Admitting: Neurology

## 2021-08-26 ENCOUNTER — Ambulatory Visit: Payer: BC Managed Care – PPO | Admitting: Family

## 2021-08-26 ENCOUNTER — Other Ambulatory Visit: Payer: Self-pay

## 2021-08-26 ENCOUNTER — Encounter: Payer: Self-pay | Admitting: Family

## 2021-08-26 VITALS — BP 130/86 | HR 72 | Temp 97.4°F | Resp 16 | Ht 68.0 in | Wt 201.0 lb

## 2021-08-26 DIAGNOSIS — G243 Spasmodic torticollis: Secondary | ICD-10-CM | POA: Diagnosis not present

## 2021-08-26 DIAGNOSIS — Z1211 Encounter for screening for malignant neoplasm of colon: Secondary | ICD-10-CM | POA: Diagnosis not present

## 2021-08-26 DIAGNOSIS — K219 Gastro-esophageal reflux disease without esophagitis: Secondary | ICD-10-CM

## 2021-08-26 DIAGNOSIS — E7800 Pure hypercholesterolemia, unspecified: Secondary | ICD-10-CM | POA: Diagnosis not present

## 2021-08-26 DIAGNOSIS — Z789 Other specified health status: Secondary | ICD-10-CM

## 2021-08-26 NOTE — Progress Notes (Signed)
Provider: Richarda Blade FNP-C   Capri Raben, Donalee Citrin, NP  Patient Care Team: Shawnae Leiva, Donalee Citrin, NP as PCP - General (Family Medicine)  Extended Emergency Contact Information Primary Emergency Contact: [facility] Address: [address]          [place], [place] [address] Darden Amber of [place] Phone: [phone] Relation: Spouse Secondary Emergency Contact: Regino Bellow Address: [address]          [place], [place] [address] Darden Amber of [place] Home Phone: [phone] Work Phone: [phone] Mobile Phone: [phone] Relation: Father Code Status:  Full Code  Goals of care: Advanced Directive information Advanced Directives 08/26/2021  Does Patient Have a Medical Advance Directive? No  Does patient want to make changes to medical advance directive? -  Would patient like information on creating a medical advance directive? No - Patient declined     Chief Complaint  Patient presents with   Medical Management of Chronic Issues    10 month follow-up. Discuss need for colonoscopy, shingrix, flu, and covid vaccines or postpone/exclude if patient refuses.     HPI:  Pt is a 66 y.o. male seen today for 6 months follow up for medical management of chronic diseases.  States continues to follow up with Neurologist Dr.Yan Yijun for neck spasm treated with Botulinum toxin injection which has been effective.  He denies any acute issues.Has had no recent acute illness or hospitalization.  Has a train and exercises three times per week. Continues to watch his diet.states weight at home was 192 lbs this morning though weight here was 201 lbs possible clothes/shoes and phone in the pockets. Mother passed away this year coping well.  Continues to drink 2 glasses of red wine daily at bedtime which he states helps to relax him.  He is due for COVID-19 booster vaccine,Influenza and Shingrix vaccine but decline all vaccine.  Also due for screening colonoscopy but prefers  cologuard.will order today.    Past Medical History:  Diagnosis Date   Cervical dystonia    Transaminitis    Past Surgical History:  Procedure Laterality Date   TONSILLECTOMY  1963    No Known Allergies  Allergies as of 08/26/2021   No Known Allergies      Medication List        Accurate as of August 26, 2021  1:27 PM. If you have any questions, ask your nurse or doctor.          AbobotulinumtoxinA 500 units Solr injection Commonly known as: DYSPORT Inject 500 Units into the muscle. DYSPORT 500 UNITS TO BE ADMINISTERED TO NECK MUSCLES EVERY 3 MONTHS. What changed: Another medication with the same name was removed. Continue taking this medication, and follow the directions you see here. Changed by: Caesar Bookman, NP   ELDERBERRY PO Take 2 tablets by mouth daily.   famotidine-calcium carbonate-magnesium hydroxide [phone] MG chewable tablet Commonly known as: PEPCID COMPLETE Chew 1 tablet by mouth daily as needed.   Protein Powd Take 1 Container by mouth daily. Shake in the morning        Review of Systems  Constitutional:  Negative for appetite change, chills, fatigue, fever and unexpected weight change.  HENT:  Negative for congestion, dental problem, ear discharge, ear pain, facial swelling, hearing loss, nosebleeds, postnasal drip, rhinorrhea, sinus pressure, sinus pain, sneezing, sore throat, tinnitus and trouble swallowing.   Eyes:  Negative for pain, discharge, redness, itching and visual disturbance.  Respiratory:  Negative for cough, chest tightness, shortness of  breath and wheezing.   Cardiovascular:  Negative for chest pain, palpitations and leg swelling.  Gastrointestinal:  Negative for abdominal distention, abdominal pain, blood in stool, constipation, diarrhea, nausea and vomiting.  Endocrine: Negative for cold intolerance, heat intolerance, polydipsia, polyphagia and polyuria.  Genitourinary:  Negative for difficulty urinating, dysuria,  flank pain, frequency and urgency.  Musculoskeletal:  Positive for arthralgias. Negative for back pain, gait problem, joint swelling, myalgias, neck pain and neck stiffness.       Left great toe arthritic pain worst with cold weather.   Skin:  Negative for color change, pallor, rash and wound.  Neurological:  Negative for dizziness, syncope, speech difficulty, weakness, light-headedness, numbness and headaches.  Hematological:  Does not bruise/bleed easily.  Psychiatric/Behavioral:  Negative for agitation, behavioral problems, confusion, hallucinations, self-injury, sleep disturbance and suicidal ideas. The patient is not nervous/anxious.    Immunization History  Administered Date(s) Administered   J[employer] (J&J) SARS-COV-2 Vaccination 12/14/2019   Pertinent  Health Maintenance Due  Topic Date Due   COLONOSCOPY (Pts 45-59yrs Insurance coverage will need to be confirmed)  Never done   INFLUENZA VACCINE  Never done   Fall Risk 03/30/2019 09/28/2019 03/28/2020 09/26/2020 08/26/2021  Falls in the past year? 0 0 0 0 0  Was there an injury with Fall? 0 0 0 0 0  Fall Risk Category Calculator 0 0 0 0 0  Fall Risk Category Low Low Low Low Low  Patient Fall Risk Level - Low fall risk Low fall risk Low fall risk Low fall risk  Patient at Risk for Falls Due to - - - - No Fall Risks  Fall risk Follow up - - - - Falls evaluation completed   Functional Status Survey:    Vitals:   08/26/21 1319  BP: 130/86  Pulse: 72  Resp: 16  Temp: (!) 97.4 F (36.3 C)  SpO2: 98%  Weight: 201 lb (91.2 kg)  Height: 5\' 8"  (1.727 m)   Body mass index is 30.56 kg/m. Physical Exam Vitals reviewed.  Constitutional:      General: He is not in acute distress.    Appearance: Normal appearance. He is obese. He is not ill-appearing or diaphoretic.  HENT:     Head: Normocephalic.     Right Ear: Tympanic membrane, ear canal and external ear normal. There is no impacted cerumen.     Left Ear: Tympanic membrane, ear  canal and external ear normal. There is no impacted cerumen.     Nose: Nose normal. No congestion or rhinorrhea.     Mouth/Throat:     Mouth: Mucous membranes are moist.     Pharynx: Oropharynx is clear. No oropharyngeal exudate or posterior oropharyngeal erythema.  Eyes:     General: No scleral icterus.       Right eye: No discharge.        Left eye: No discharge.     Extraocular Movements: Extraocular movements intact.     Conjunctiva/sclera: Conjunctivae normal.     Pupils: Pupils are equal, round, and reactive to light.  Neck:     Vascular: No carotid bruit.  Cardiovascular:     Rate and Rhythm: Normal rate and regular rhythm.     Pulses: Normal pulses.     Heart sounds: Normal heart sounds. No murmur heard.   No friction rub. No gallop.  Pulmonary:     Effort: Pulmonary effort is normal. No respiratory distress.     Breath sounds: Normal breath sounds. No wheezing, rhonchi  or rales.  Chest:     Chest wall: No tenderness.  Abdominal:     General: Bowel sounds are normal. There is no distension.     Palpations: Abdomen is soft. There is no mass.     Tenderness: There is no abdominal tenderness. There is no right CVA tenderness, left CVA tenderness, guarding or rebound.  Musculoskeletal:        General: No swelling or tenderness. Normal range of motion.     Cervical back: Normal range of motion. No rigidity or tenderness.     Right lower leg: No edema.     Left lower leg: No edema.  Lymphadenopathy:     Cervical: No cervical adenopathy.  Skin:    General: Skin is warm and dry.     Coloration: Skin is not pale.     Findings: No bruising, erythema, lesion or rash.  Neurological:     Mental Status: He is alert and oriented to person, place, and time.     Cranial Nerves: No cranial nerve deficit.     Sensory: No sensory deficit.     Motor: No weakness.     Coordination: Coordination normal.     Gait: Gait normal.  Psychiatric:        Mood and Affect: Mood normal.         Speech: Speech normal.        Behavior: Behavior normal.        Thought Content: Thought content normal.        Judgment: Judgment normal.    Labs reviewed: Recent Labs    09/23/20 0907  NA 138  K 4.7  CL 100  CO2 31  GLUCOSE 90  BUN 17  CREATININE 1.12  CALCIUM 9.4   Recent Labs    09/23/20 0907  AST 18  ALT 16  BILITOT 0.4  PROT 6.7   Recent Labs    09/23/20 0907  WBC 3.7*  NEUTROABS 2,387  HGB 14.4  HCT 43.0  MCV 92.1  PLT 296   No results found for: TSH Lab Results  Component Value Date   HGBA1C 5.2 09/23/2020   Lab Results  Component Value Date   CHOL 156 09/23/2020   HDL 53 09/23/2020   LDLCALC 89 09/23/2020   TRIG 62 09/23/2020   CHOLHDL 2.9 09/23/2020    Significant Diagnostic Results in last 30 days:  No results found.  Assessment/Plan 1. Cervical dystonia Stable  - continue to follow up with Neurologist   2. Gastroesophageal reflux disease without esophagitis Symptoms under controlled. Not on medication  - No S/Sx of GI bleed.   3. Pure hypercholesterolemia LDL at goal  - continue dietary modification and exercise at least 3 times per week for 30 minutes.   4. Colon cancer screening Asymptomatic  Due for colonoscopy but prefers cologuard.  - Cologuard  5. Alcohol use Drinks 2 glasses of red wine daily  Moderation uses of wine advised  Will continue to monitor LFT's.   Family/ staff Communication: Reviewed plan of care with patient verbalized understanding.   Labs/tests ordered: None   Next Appointment : 6 months for Annual Physical Examination with fasting labs.   Caesar Bookman, NP

## 2021-09-10 DIAGNOSIS — G243 Spasmodic torticollis: Secondary | ICD-10-CM | POA: Diagnosis not present

## 2021-09-29 ENCOUNTER — Telehealth: Payer: Self-pay | Admitting: Neurology

## 2021-09-29 NOTE — Telephone Encounter (Signed)
Patient was due to have Dysport injection 12/7. Patient called to cancel this d/t schedule conflicts. Patient was rescheduled to next available of 1/19, which he also needed to cx. Multiple calls have been made to patient about rescheduling but calls have not been returned, so patient does not currently have an appointment scheduled.   PA for Dysport through [employer] expires 10/08/21. Although patient does not currently have an appointment, I have filled out [employer] PA form to renew authorization. PA will be faxed to plan for determination.

## 2021-10-01 NOTE — Telephone Encounter (Signed)
Received approval from [place] (10/09/2021-09/09/2022).

## 2021-10-02 ENCOUNTER — Ambulatory Visit: Payer: BC Managed Care – PPO | Admitting: Neurology

## 2021-10-08 NOTE — Telephone Encounter (Signed)
Patient called today and LVM asking to schedule his Dysport injection.

## 2021-10-09 NOTE — Telephone Encounter (Signed)
I called pt Andrew Bentley to get Dysport appointment rescheduled.

## 2021-11-10 ENCOUNTER — Encounter: Payer: Self-pay | Admitting: Neurology

## 2021-11-10 ENCOUNTER — Ambulatory Visit: Payer: BC Managed Care – PPO | Admitting: Neurology

## 2021-11-10 VITALS — BP 146/88 | HR 86 | Ht 68.0 in | Wt 201.0 lb

## 2021-11-10 DIAGNOSIS — G243 Spasmodic torticollis: Secondary | ICD-10-CM

## 2021-11-10 NOTE — Progress Notes (Signed)
Dysport 500 units x 1 vial  [id number] [phone] Exp-03-13-2022

## 2021-11-10 NOTE — Progress Notes (Signed)
PATIENT: Andrew Bentley DOB: 1955-11-23  HISTORICAL  Andrew Bentley is a 66 years old right-handed Caucasian male, came back for revisit, for potential EMG guided Botox injection for his cervical dystonia  He had a gradual onset neck turning to the left side, abnormal neck posture since 2005, progressively worse since 2008, he was enrolled in Dysport research trial sponsored by [employer] since 2013, he totally received 4 injection, last injection was November 2013, complete the study successfully in April 2014, which is his last office visit.   During the trial, he receive EMG guided dysport injection every 3 months, 500 units, he tolerated the injection very well, showed significant improvement of his neck posturing  He continue to works out regularly, he denies gait difficulty, he complains of left occipital area radiating pain to his left parietal area, he denies radiating pain from his neck to his arms, and hands  UPDATE Jan 20th 2016: He signed a consent form, also documented under communication dated October 03 2014, potential side effect explained, he had a bulky right sternocleidomastoid, left posterior cervical paraspinal muscles.  UPDATE February 13 2015: He responded very well to previous injection in October 03 2014,he received 500 units of Dysport, he can move his neck better, no significant side effect, no neck extension weakness, no swallowing difficulties  He has almost constant head turning to the left side even at the peak benefit of the Dysport injection  UPDATE May 11th 2017: Last injection was in Sept 2016, he received Dysport 500 units times 2, he had transient dysphagia, and dysarthria lasting for couple weeks, otherwise injection was very helpful, he continued to exercise regularly, receiving chiropractic adjustment regularly  Update April 29 2016:  He responded very well to previous injection Dysport 500 unitsx2 in May 2017, did reported side effect of mild transient  swallowing difficulty, weak voice, difficulty extending his neck,  Update September 03 2016, He responded very well to previous injectionsignificant side effect notice,  UPDATE Jan 13 2017: He had returning of frequent neck no no shaking movement, forceful pushing towards the left side, also lean towards his left shoulder, he has right posterior neck pain  Update April 21 2017: Responded well to previous injection, he has significant posterior neck muscle atrophy, did complains of mild hoarse voice after injection  UPDATE Jul 23 2017: He respond well to previous injection, recent onset of low back pain  UPDATE Nov 03 2017: He has no significant side effect from previous injection, complains of left side of the neck muscle tension,  UPDATE Feb 02 2018: He responded very well to previous injection, no significant side effect noticed.  UPDATE May 12 2018: He is doing very well with his injection, workout regularly,  UPDATE Aug 31 2018: He responded very well to previous injection, complains of right posterior neck muscle tension  UPDATE Jan 25 2019: He responded very well to previous injection  UPDATE May 09 2019: He responded well to previous injection  UPDATE Aug 15 2019: He has mild left-sided neck pain,  UPDATE December 06 2019: He did well with previous injection, also receiving massage therapy  Update March 06, 2020: He has significant atrophy of left posterior cervical muscle, is receiving massage therapy, chiropractor regularly which has been helpful,  UPDATE Sept 27 2021: He responded very well to Dysport injection, no significant side effect noticed, along with massage, workout regularly, chiropractor, his symptoms overall has much improved, less muscle tightness, spasm  Update October 15, 2020: This is  4 months from previous injection, only recently he noticed left posterior neck muscle tightness, improved by stretching exercise, and physical therapy, we decided to  decrease his Dysport dose from 1000 to  500 units today  Update February 12, 2021: He responded well to previous Dysport injection, 500 units only, tolerating it well, like lower dose,  UPDATE Sept 7 2022: He responded well to previous injection, no significant side effect noted  Update November 10, 2021, He responded well to previous injection, continue to workout, massage therapy   REVIEW OF SYSTEMS: Full 14 system review of systems performed and notable only for as above  PHYSICAL EXAM   Vitals:   11/10/21 1303  BP: (!) 146/88  Pulse: 86  Weight: 201 lb (91.2 kg)  Height: 5\' 8"  (1.727 m)   Not recorded     Body mass index is 30.56 kg/m.  DIAGNOSTIC DATA (LABS, IMAGING, TESTING) - I reviewed patient records, labs, notes, testing and imaging myself where available.  ASSESSMENT AND PLAN  Andrew Bentley is a 66 y.o. male with long-standing history of cervical dystonia, responded very well to previous EMG guided dysport injection.  He has moderate left turning, mild retrocollis, mild left shoulder elevation, mild right laterocollis, frequent small amplitude no-no head shaking, significant atrophy of left posterior neck muscle  Under EMG guidance, 500  units of Dysport were injected (500 /2.5 cc for total of 5.0 cc)  Right semispinalis 0.5 cc Left longissimus capitis 0.5 cc Left splenius capitis 0.5 cc Left splenius cervix 0.5 cc Left semispinalis 0.5 units  Return to clinic in 3 months  66, M.D. Ph.D.  [facility] [address] [place], [place] [phone]

## 2021-11-16 MED ORDER — ABOBOTULINUMTOXINA 500 UNITS IM SOLR
500.0000 [IU] | Freq: Once | INTRAMUSCULAR | Status: AC
  Administered 2021-11-16: 500 [IU] via INTRAMUSCULAR

## 2022-01-08 MED ORDER — ABOBOTULINUMTOXINA 500 UNITS IM SOLR: 500.0000 [IU] | Freq: Once | INTRAMUSCULAR | 0 refills | Status: AC

## 2022-01-08 NOTE — Addendum Note (Signed)
Addended by: Christophe Louis E on: 01/08/2022 04:42 PM ? ? Modules accepted: Orders ? ?

## 2022-01-08 NOTE — Telephone Encounter (Signed)
Rx sent to [facility].

## 2022-01-08 NOTE — Telephone Encounter (Signed)
Please send Dysport Rx to [facility]. ?

## 2022-02-05 DIAGNOSIS — G243 Spasmodic torticollis: Secondary | ICD-10-CM | POA: Diagnosis not present

## 2022-02-10 NOTE — Telephone Encounter (Signed)
Received (1) 500 unit vial of Dysport from [facility].

## 2022-02-11 ENCOUNTER — Ambulatory Visit: Payer: BC Managed Care – PPO | Admitting: Neurology

## 2022-02-11 ENCOUNTER — Encounter: Payer: Self-pay | Admitting: Neurology

## 2022-02-11 VITALS — BP 138/93 | HR 73 | Ht 68.0 in | Wt 200.0 lb

## 2022-02-11 DIAGNOSIS — G243 Spasmodic torticollis: Secondary | ICD-10-CM | POA: Diagnosis not present

## 2022-02-11 NOTE — Progress Notes (Signed)
PATIENT: Andrew Bentley DOB: 11/20/1955  HISTORICAL  Andrew Bentley is a 66 years old right-handed Caucasian male, came back for revisit, for potential EMG guided Botox injection for his cervical dystonia  He had a gradual onset neck turning to the left side, abnormal neck posture since 2005, progressively worse since 2008, he was enrolled in Dysport research trial sponsored by [employer] since 2013, he totally received 4 injection, last injection was November 2013, complete the study successfully in April 2014, which is his last office visit.   During the trial, he receive EMG guided dysport injection every 3 months, 500 units, he tolerated the injection very well, showed significant improvement of his neck posturing  He continue to works out regularly, he denies gait difficulty, he complains of left occipital area radiating pain to his left parietal area, he denies radiating pain from his neck to his arms, and hands  UPDATE Jan 20th 2016: He signed a consent form, also documented under communication dated October 03 2014, potential side effect explained, he had a bulky right sternocleidomastoid, left posterior cervical paraspinal muscles.  UPDATE February 13 2015: He responded very well to previous injection in October 03 2014,he received 500 units of Dysport, he can move his neck better, no significant side effect, no neck extension weakness, no swallowing difficulties  He has almost constant head turning to the left side even at the peak benefit of the Dysport injection  UPDATE May 11th 2017: Last injection was in Sept 2016, he received Dysport 500 units times 2, he had transient dysphagia, and dysarthria lasting for couple weeks, otherwise injection was very helpful, he continued to exercise regularly, receiving chiropractic adjustment regularly  Update April 29 2016:  He responded very well to previous injection Dysport 500 unitsx2 in May 2017, did reported side effect of mild transient  swallowing difficulty, weak voice, difficulty extending his neck,  Update September 03 2016, He responded very well to previous injectionsignificant side effect notice,  UPDATE Jan 13 2017: He had returning of frequent neck no no shaking movement, forceful pushing towards the left side, also lean towards his left shoulder, he has right posterior neck pain  Update April 21 2017: Responded well to previous injection, he has significant posterior neck muscle atrophy, did complains of mild hoarse voice after injection  UPDATE Jul 23 2017: He respond well to previous injection, recent onset of low back pain  UPDATE Nov 03 2017: He has no significant side effect from previous injection, complains of left side of the neck muscle tension,  UPDATE Feb 02 2018: He responded very well to previous injection, no significant side effect noticed.  UPDATE May 12 2018: He is doing very well with his injection, workout regularly,  UPDATE Aug 31 2018: He responded very well to previous injection, complains of right posterior neck muscle tension  UPDATE Jan 25 2019: He responded very well to previous injection  UPDATE May 09 2019: He responded well to previous injection  UPDATE Aug 15 2019: He has mild left-sided neck pain,  UPDATE December 06 2019: He did well with previous injection, also receiving massage therapy  Update March 06, 2020: He has significant atrophy of left posterior cervical muscle, is receiving massage therapy, chiropractor regularly which has been helpful,  UPDATE Sept 27 2021: He responded very well to Dysport injection, no significant side effect noticed, along with massage, workout regularly, chiropractor, his symptoms overall has much improved, less muscle tightness, spasm  Update October 15, 2020: This is  4 months from previous injection, only recently he noticed left posterior neck muscle tightness, improved by stretching exercise, and physical therapy, we decided to  decrease his Dysport dose from 1000 to  500 units today  Update February 12, 2021: He responded well to previous Dysport injection, 500 units only, tolerating it well, like lower dose,  UPDATE Sept 7 2022: He responded well to previous injection, no significant side effect noted  Update November 10, 2021, He responded well to previous injection, continue to workout, massage therapy   REVIEW OF SYSTEMS: Full 14 system review of systems performed and notable only for as above  PHYSICAL EXAM   Vitals:   02/11/22 1352  Height: 5\' 8"  (1.727 m)   Not recorded     Body mass index is 30.56 kg/m.  DIAGNOSTIC DATA (LABS, IMAGING, TESTING) - I reviewed patient records, labs, notes, testing and imaging myself where available.  ASSESSMENT AND PLAN  Andrew Bentley is a 66 y.o. male with long-standing history of cervical dystonia, responded very well to previous EMG guided dysport injection.  He has moderate left turning, mild retrocollis, mild left shoulder elevation, mild right laterocollis, frequent small amplitude no-no head shaking, significant atrophy of left posterior neck muscle  Under EMG guidance, 500  units of Dysport were injected (500 /2.5 cc for total of 5.0 cc)  Right lower sternocleidomastoid 0.5 cc Left longissimus capitis 0.5 cc Left splenius capitis 0.5 cc Left splenius cervix 0.5 cc Left semispinalis 0.5 units  Return to clinic in 3 months  Marcial Pacas, M.D. Ph.D.  [facility] [address] [place], [place] [address] [phone]

## 2022-02-11 NOTE — Progress Notes (Signed)
**  Dysport 500 units x 1 vial, NDC [address]-1, Lot [id number], Exp 11/12/2023, specialty pharmacy.//mck,rn**

## 2022-02-20 ENCOUNTER — Other Ambulatory Visit: Payer: Self-pay | Admitting: Family

## 2022-02-20 DIAGNOSIS — E663 Overweight: Secondary | ICD-10-CM

## 2022-02-20 DIAGNOSIS — E7800 Pure hypercholesterolemia, unspecified: Secondary | ICD-10-CM

## 2022-02-23 ENCOUNTER — Other Ambulatory Visit: Payer: BC Managed Care – PPO

## 2022-02-23 DIAGNOSIS — E7800 Pure hypercholesterolemia, unspecified: Secondary | ICD-10-CM | POA: Diagnosis not present

## 2022-02-23 LAB — LIPID PANEL
Cholesterol: 196 mg/dL (ref <200)
HDL: 73 mg/dL (ref >=40)
LDL Cholesterol (Calc): 107 mg/dL (calc) — ABNORMAL HIGH
Non-HDL Cholesterol (Calc): 123 mg/dL (calc) (ref <130)
Total CHOL/HDL Ratio: 2.7 (calc) (ref <5.0)
Triglycerides: 73 mg/dL (ref <150)

## 2022-02-23 LAB — BASIC METABOLIC PANEL WITH GFR
BUN: 14 mg/dL (ref 7–25)
CO2: 30 mmol/L (ref 20–32)
Calcium: 9.5 mg/dL (ref 8.6–10.3)
Chloride: 104 mmol/L (ref 98–110)
Creat: 1.1 mg/dL (ref 0.70–1.35)
Glucose, Bld: 95 mg/dL (ref 65–99)
Potassium: 5.1 mmol/L (ref 3.5–5.3)
Sodium: 140 mmol/L (ref 135–146)
eGFR: 75 mL/min/{1.73_m2} (ref >=60)

## 2022-02-24 ENCOUNTER — Encounter: Payer: Self-pay | Admitting: Family

## 2022-02-25 ENCOUNTER — Ambulatory Visit: Payer: BC Managed Care – PPO | Admitting: Family

## 2022-02-25 ENCOUNTER — Encounter: Payer: Self-pay | Admitting: Family

## 2022-02-25 VITALS — BP 116/82 | HR 67 | Temp 97.3°F | Resp 18 | Ht 68.0 in | Wt 205.0 lb

## 2022-02-25 DIAGNOSIS — H9313 Tinnitus, bilateral: Secondary | ICD-10-CM

## 2022-02-25 DIAGNOSIS — E7800 Pure hypercholesterolemia, unspecified: Secondary | ICD-10-CM | POA: Diagnosis not present

## 2022-02-25 DIAGNOSIS — K219 Gastro-esophageal reflux disease without esophagitis: Secondary | ICD-10-CM | POA: Diagnosis not present

## 2022-02-25 NOTE — Progress Notes (Signed)
Provider: Marlowe Sax FNP-C  Esly Selvage, Nelda Bucks, NP  Patient Care Team: Kamila Broda, Nelda Bucks, NP as PCP - General (Family Medicine)  Extended Emergency Contact Information Primary Emergency Contact: Tristan,Maryann Address: [address]          [place], [place] [address] Johnnette Litter of [place] Phone: [phone] Relation: Spouse Secondary Emergency Contact: Constance Holster Address: [address]          [place], [place] [address] Johnnette Litter of [place] Phone: [phone] Work Phone: [phone] Mobile Phone: [phone] Relation: Father  Code Status:  Full Code  Goals of care: Advanced Directive information    02/24/2022    4:28 PM  Advanced Directives  Does Patient Have a Medical Advance Directive? No  Would patient like information on creating a medical advance directive? No - Patient declined     Chief Complaint  Patient presents with   Medical Management of Chronic Issues    Patient is here for a 32mF/U and review of recent labs (active on mycahrt). Discuss need for colonoscopy or Cologuard. No falls in last year. No medications right now. NCIR verified    HPI:  Pt is a 66y.o. male seen today for 6 months follow-up for medical management of chronic issues. Also here to review and discuss lab work.He denies any acute issues today. Lab work unremarkable except LDL cholesterol went slightly high compared to previous.  States his diet has changed recently started eating bread which he had stopped in the past.  Also likes to eat cheese. His weight went up 5 pounds compared to previous.  Alcohol use -drinks 2-3 wine daily  Tinnitus -stated got some over-the-counter sublingual drops that are supposed to help with ringing in the ears.  Has used this but so far no relief  Health maintenance:  Due for colon cancer screening. Cologuard previously ordered.  States received Cologuard at home and was reminded today to book pickup date.  States will send  Cologuard.  Past Medical History:  Diagnosis Date   Cervical dystonia    Transaminitis    Past Surgical History:  Procedure Laterality Date   TONSILLECTOMY  1963    No Known Allergies  Outpatient Encounter Medications as of 02/25/2022  Medication Sig   AbobotulinumtoxinA (DYSPORT IM) Inject 500 Units into the muscle every 3 (three) months.   [DISCONTINUED] ELDERBERRY PO Take 2 tablets by mouth daily.   [DISCONTINUED] famotidine-calcium carbonate-magnesium hydroxide (PEPCID COMPLETE) [phone] MG chewable tablet Chew 1 tablet by mouth daily as needed.   [DISCONTINUED] Protein POWD Take 1 Container by mouth daily. Shake in the morning   No facility-administered encounter medications on file as of 02/25/2022.    Review of Systems  Constitutional:  Negative for appetite change, chills, fatigue, fever and unexpected weight change.  HENT:  Positive for tinnitus. Negative for congestion, dental problem, ear discharge, ear pain, facial swelling, hearing loss, nosebleeds, postnasal drip, rhinorrhea, sinus pressure, sinus pain, sneezing, sore throat and trouble swallowing.   Eyes:  Negative for pain, discharge, redness, itching and visual disturbance.  Respiratory:  Negative for cough, chest tightness, shortness of breath and wheezing.   Cardiovascular:  Negative for chest pain, palpitations and leg swelling.  Gastrointestinal:  Negative for abdominal distention, abdominal pain, blood in stool, constipation, diarrhea, nausea and vomiting.  Endocrine: Negative for cold intolerance, heat intolerance, polydipsia, polyphagia and polyuria.  Genitourinary:  Negative for difficulty urinating, dysuria, flank pain, frequency and urgency.  Musculoskeletal:  Negative for arthralgias, back pain,  gait problem, joint swelling, myalgias, neck pain and neck stiffness.  Skin:  Negative for color change, pallor, rash and wound.  Neurological:  Negative for dizziness, syncope, speech difficulty, weakness,  light-headedness, numbness and headaches.  Hematological:  Does not bruise/bleed easily.  Psychiatric/Behavioral:  Negative for agitation, behavioral problems, confusion, hallucinations, self-injury, sleep disturbance and suicidal ideas. The patient is not nervous/anxious.     Immunization History  Administered Date(s) Administered   J[employer]sen (J&J) SARS-COV-2 Vaccination 12/14/2019   Pertinent  Health Maintenance Due  Topic Date Due   COLONOSCOPY (Pts 45-10yr Insurance coverage will need to be confirmed)  Never done   INFLUENZA VACCINE  Discontinued      09/28/2019   10:00 AM 03/28/2020    8:59 AM 09/26/2020    9:39 AM 08/26/2021    1:09 PM 02/25/2022    1:04 PM  Fall Risk  Falls in the past year? 0 0 0 0 0  Was there an injury with Fall? 0 0 0 0 0  Fall Risk Category Calculator 0 0 0 0 0  Fall Risk Category Low Low Low Low Low  Patient Fall Risk Level Low fall risk Low fall risk Low fall risk Low fall risk Low fall risk  Patient at Risk for Falls Due to    No Fall Risks No Fall Risks  Fall risk Follow up    Falls evaluation completed Falls evaluation completed   Functional Status Survey:    Vitals:   02/25/22 1302  BP: 116/82  Pulse: 67  Resp: 18  Temp: (!) 97.3 F (36.3 C)  TempSrc: Temporal  SpO2: 98%  Weight: 205 lb (93 kg)  Height: '5\' 8"'  (1.727 m)   Body mass index is 31.17 kg/m. Physical Exam Vitals reviewed.  Constitutional:      General: He is not in acute distress.    Appearance: Normal appearance. He is obese. He is not ill-appearing or diaphoretic.  HENT:     Head: Normocephalic.     Right Ear: Tympanic membrane, ear canal and external ear normal. There is no impacted cerumen.     Left Ear: Tympanic membrane, ear canal and external ear normal. There is no impacted cerumen.     Nose: Nose normal. No congestion or rhinorrhea.     Mouth/Throat:     Mouth: Mucous membranes are moist.     Pharynx: Oropharynx is clear. No oropharyngeal exudate or posterior  oropharyngeal erythema.  Eyes:     General: No scleral icterus.       Right eye: No discharge.        Left eye: No discharge.     Extraocular Movements: Extraocular movements intact.     Conjunctiva/sclera: Conjunctivae normal.     Pupils: Pupils are equal, round, and reactive to light.  Neck:     Vascular: No carotid bruit.  Cardiovascular:     Rate and Rhythm: Normal rate and regular rhythm.     Pulses: Normal pulses.     Heart sounds: Normal heart sounds. No murmur heard.    No friction rub. No gallop.  Pulmonary:     Effort: Pulmonary effort is normal. No respiratory distress.     Breath sounds: Normal breath sounds. No wheezing, rhonchi or rales.  Chest:     Chest wall: No tenderness.  Abdominal:     General: Bowel sounds are normal. There is no distension.     Palpations: Abdomen is soft. There is no mass.     Tenderness: There  is no abdominal tenderness. There is no right CVA tenderness, left CVA tenderness, guarding or rebound.  Musculoskeletal:        General: No swelling or tenderness. Normal range of motion.     Cervical back: Normal range of motion. No rigidity or tenderness.     Right lower leg: No edema.     Left lower leg: No edema.  Lymphadenopathy:     Cervical: No cervical adenopathy.  Skin:    General: Skin is warm and dry.     Coloration: Skin is not pale.     Findings: No bruising, erythema, lesion or rash.  Neurological:     Mental Status: He is alert and oriented to person, place, and time.     Cranial Nerves: No cranial nerve deficit.     Sensory: No sensory deficit.     Motor: No weakness.     Coordination: Coordination normal.     Gait: Gait normal.  Psychiatric:        Mood and Affect: Mood normal.        Speech: Speech normal.        Behavior: Behavior normal.        Thought Content: Thought content normal.        Judgment: Judgment normal.    Labs reviewed: Recent Labs    02/23/22 0827  NA 140  K 5.1  CL 104  CO2 30  GLUCOSE 95   BUN 14  CREATININE 1.10  CALCIUM 9.5   No results for input(s): "AST", "ALT", "ALKPHOS", "BILITOT", "PROT", "ALBUMIN" in the last 8760 hours. No results for input(s): "WBC", "NEUTROABS", "HGB", "HCT", "MCV", "PLT" in the last 8760 hours. No results found for: "TSH" Lab Results  Component Value Date   HGBA1C 5.2 09/23/2020   Lab Results  Component Value Date   CHOL 196 02/23/2022   HDL 73 02/23/2022   LDLCALC 107 (H) 02/23/2022   TRIG 73 02/23/2022   CHOLHDL 2.7 02/23/2022    Significant Diagnostic Results in last 30 days:  No results found.  Assessment/Plan  1. Pure hypercholesterolemia LDL 107 - Dietary modification and exercise at least 3 times per week for 30 minutes advised. - Lipid panel; Future  2. Gastroesophageal reflux disease without esophagitis Symptoms controlled. H/H stable.No tarry or black stool  - advised to avoid eating meals late in the evening and to avoid aggravating foods and spices. - CBC with Differential/Platelet; Future - CMP with eGFR([facility]); Future  3. Tinnitus of both ears Chronic   Family/ staff Communication: Reviewed plan of care with patient verbalized understanding  Labs/tests ordered:  - CBC with Differential/Platelet - CMP with eGFR([facility]) - TSH - Lipid panel  Next Appointment: Return in about 6 months (around 08/27/2022) for medical mangement of chronic issues., Fasting labs in 6 months prior to visit.   Sandrea Hughs, NP

## 2022-05-11 ENCOUNTER — Telehealth: Payer: Self-pay | Admitting: Neurology

## 2022-05-11 ENCOUNTER — Telehealth: Payer: Self-pay

## 2022-05-11 ENCOUNTER — Other Ambulatory Visit: Payer: Self-pay

## 2022-05-11 MED ORDER — DYSPORT 500 UNITS IM SOLR: 500.0000 [IU] | Freq: Once | INTRAMUSCULAR | 2 refills | Status: AC

## 2022-05-11 NOTE — Telephone Encounter (Signed)
Unable to LVM due to VM box being full, sent mychart msg asking pt to call back to reschedule 8/30 appointment for dysport injection due to insurance needing to process prescription

## 2022-05-11 NOTE — Telephone Encounter (Signed)
I called [facility] to schedule the delivery of dysport. They need an updated prescription.I spoke to [place], [place] and provided a VO for dysport. They report that it can take 5-8 business days to process this RX even though his appointment is 05/13/22. This patient's appointment will need to be rescheduled until we can receive his dysport.

## 2022-05-11 NOTE — Telephone Encounter (Signed)
Noted, will monitor for med.

## 2022-05-13 ENCOUNTER — Ambulatory Visit: Payer: BC Managed Care – PPO | Admitting: Neurology

## 2022-05-19 NOTE — Telephone Encounter (Signed)
I called [facility]. I spoke with [place]. The dysport RX is still processing and it needs clarification. I was transferred to the verbal clarification line at [phone] and spoke with Etta Quill, pharm tech to confirm that dysport 500u is given in the neck q29months. [facility] will call us to set up delivery once this RX has been processed.

## 2022-05-25 NOTE — Telephone Encounter (Signed)
I called [facility]. I spent 13 minutes on the phone speaking with rep [facility]. The dysport RX is still processing. Andrew Bentley has asked that this RX be expedited. The patient has not consented for delivery but [facility] will call him and our office once the RX processes.

## 2022-05-28 NOTE — Telephone Encounter (Signed)
I called [facility] and spoke with Ariel to check status on Dysport injection. He states med is ready for shipment but needs verbal consent from the pt. He claims multiple calls have been made to the pt without feed back. I advised I would call the pt/mychart requesting him to call [facility] and give the verbal consent.   Pt is scheduled for 06/03/2022 for his injection.

## 2022-06-03 ENCOUNTER — Ambulatory Visit: Payer: BC Managed Care – PPO | Admitting: Neurology

## 2022-06-03 ENCOUNTER — Encounter: Payer: Self-pay | Admitting: Neurology

## 2022-06-03 VITALS — BP 132/82 | HR 64 | Ht 68.0 in | Wt 206.5 lb

## 2022-06-03 DIAGNOSIS — G243 Spasmodic torticollis: Secondary | ICD-10-CM

## 2022-06-03 MED ORDER — ABOBOTULINUMTOXINA 500 UNITS IM SOLR: 500.0000 [IU] | Freq: Once | INTRAMUSCULAR | Status: AC

## 2022-06-03 NOTE — Progress Notes (Signed)
1 500 unit vial of Dysport: Lot [id number] NDC [address]-60 01/12/2024  2.5 CC of Sodium Chloride  NDC 1561-5379-43 Exp: 09/22/2022

## 2022-06-05 DIAGNOSIS — G243 Spasmodic torticollis: Secondary | ICD-10-CM | POA: Diagnosis not present

## 2022-06-05 MED ORDER — ABOBOTULINUMTOXINA 500 UNITS IM SOLR
500.0000 [IU] | Freq: Once | INTRAMUSCULAR | Status: AC
  Administered 2022-06-05: 500 [IU] via INTRAMUSCULAR

## 2022-06-05 NOTE — Progress Notes (Signed)
PATIENT: Andrew Bentley DOB: 11/20/1955  HISTORICAL  Andrew Bentley is a 66 years old right-handed Caucasian male, came back for revisit, for potential EMG guided Botox injection for his cervical dystonia  He had a gradual onset neck turning to the left side, abnormal neck posture since 2005, progressively worse since 2008, he was enrolled in Dysport research trial sponsored by [employer] since 2013, he totally received 4 injection, last injection was November 2013, complete the study successfully in April 2014, which is his last office visit.   During the trial, he receive EMG guided dysport injection every 3 months, 500 units, he tolerated the injection very well, showed significant improvement of his neck posturing  He continue to works out regularly, he denies gait difficulty, he complains of left occipital area radiating pain to his left parietal area, he denies radiating pain from his neck to his arms, and hands  UPDATE Jan 20th 2016: He signed a consent form, also documented under communication dated October 03 2014, potential side effect explained, he had a bulky right sternocleidomastoid, left posterior cervical paraspinal muscles.  UPDATE February 13 2015: He responded very well to previous injection in October 03 2014,he received 500 units of Dysport, he can move his neck better, no significant side effect, no neck extension weakness, no swallowing difficulties  He has almost constant head turning to the left side even at the peak benefit of the Dysport injection  UPDATE May 11th 2017: Last injection was in Sept 2016, he received Dysport 500 units times 2, he had transient dysphagia, and dysarthria lasting for couple weeks, otherwise injection was very helpful, he continued to exercise regularly, receiving chiropractic adjustment regularly  Update April 29 2016:  He responded very well to previous injection Dysport 500 unitsx2 in May 2017, did reported side effect of mild transient  swallowing difficulty, weak voice, difficulty extending his neck,  Update September 03 2016, He responded very well to previous injectionsignificant side effect notice,  UPDATE Jan 13 2017: He had returning of frequent neck no no shaking movement, forceful pushing towards the left side, also lean towards his left shoulder, he has right posterior neck pain  Update April 21 2017: Responded well to previous injection, he has significant posterior neck muscle atrophy, did complains of mild hoarse voice after injection  UPDATE Jul 23 2017: He respond well to previous injection, recent onset of low back pain  UPDATE Nov 03 2017: He has no significant side effect from previous injection, complains of left side of the neck muscle tension,  UPDATE Feb 02 2018: He responded very well to previous injection, no significant side effect noticed.  UPDATE May 12 2018: He is doing very well with his injection, workout regularly,  UPDATE Aug 31 2018: He responded very well to previous injection, complains of right posterior neck muscle tension  UPDATE Jan 25 2019: He responded very well to previous injection  UPDATE May 09 2019: He responded well to previous injection  UPDATE Aug 15 2019: He has mild left-sided neck pain,  UPDATE December 06 2019: He did well with previous injection, also receiving massage therapy  Update March 06, 2020: He has significant atrophy of left posterior cervical muscle, is receiving massage therapy, chiropractor regularly which has been helpful,  UPDATE Sept 27 2021: He responded very well to Dysport injection, no significant side effect noticed, along with massage, workout regularly, chiropractor, his symptoms overall has much improved, less muscle tightness, spasm  Update October 15, 2020: This is  4 months from previous injection, only recently he noticed left posterior neck muscle tightness, improved by stretching exercise, and physical therapy, we decided to  decrease his Dysport dose from 1000 to  500 units today  Update February 12, 2021: He responded well to previous Dysport injection, 500 units only, tolerating it well, like lower dose,  UPDATE Sept 7 2022: He responded well to previous injection, no significant side effect noted  Update November 10, 2021, He responded well to previous injection, continue to workout, massage therapy   Update September 2023  REVIEW OF SYSTEMS: Full 14 system review of systems performed and notable only for as above  PHYSICAL EXAM   Vitals:   06/03/22 1451  BP: 132/82  Pulse: 64  Weight: 206 lb 8 oz (93.7 kg)  Height: 5\' 8"  (1.727 m)   Not recorded     Body mass index is 31.4 kg/m.  DIAGNOSTIC DATA (LABS, IMAGING, TESTING) - I reviewed patient records, labs, notes, testing and imaging myself where available.  ASSESSMENT AND PLAN  Andrew Bentley is a 66 y.o. male with long-standing history of cervical dystonia, responded very well to previous EMG guided dysport injection.  He has moderate left turning, mild retrocollis, mild left shoulder elevation, mild right laterocollis, frequent small amplitude no-no head shaking, significant atrophy of left posterior neck muscle  Under EMG guidance, 500  units of Dysport were injected (500 /2.5 cc for total of 5.0 cc)  Right lower sternocleidomastoid 0.5 cc Left longissimus capitis 0.5 cc Left splenius capitis 0.5 cc Left splenius cervix 0.5 cc Left semispinalis 0.5 units  Return to clinic in 3 months  Marcial Pacas, M.D. Ph.D.  [facility] [address] [place], [place] [address] [phone]

## 2022-07-28 ENCOUNTER — Telehealth: Payer: Self-pay

## 2022-07-28 ENCOUNTER — Other Ambulatory Visit (HOSPITAL_COMMUNITY): Payer: Self-pay

## 2022-07-28 NOTE — Telephone Encounter (Signed)
Renewal PA for Dysport 500 units.  Dx G24.3 EMG CPT [phone]

## 2022-07-28 NOTE — Telephone Encounter (Signed)
Patient Advocate Encounter   Received notification that prior authorization for Dysport 500UNIT solution is required.   PA submitted on 07/28/2022 Key [id number] Status is pending       Roland Earl, CPhT Pharmacy Patient Advocate Specialist [facility] Patient Advocate Team Direct Number: [phone]  Fax: [phone]

## 2022-08-03 ENCOUNTER — Other Ambulatory Visit (HOSPITAL_COMMUNITY): Payer: Self-pay

## 2022-08-03 NOTE — Telephone Encounter (Signed)
Patient Advocate Encounter  Prior Authorization for Dysport 500UNIT solution  has been approved.     Effective dates: 07/28/2022 through 06/28/2023  Must be filled at [facility] through 09/13/2022, must be filled at [facility] after 09/14/2022    Roland Earl, CPhT Pharmacy Patient Advocate Specialist [facility] Patient Advocate Team Direct Number: [phone]  Fax: [phone]

## 2022-08-04 MED ORDER — DYSPORT 500 UNITS IM SOLR: 500.0000 [IU] | INTRAMUSCULAR | 1 refills | Status: DC

## 2022-08-04 NOTE — Telephone Encounter (Signed)
I have ordered Dysport through [facility].  Can we call [facility] on Monday of next week and schedule shipment for pt's drug he is on the schedule for 08/26/2022 with Dr. Terrace Arabia.  [facility]'s number is [phone].  Thank you.

## 2022-08-04 NOTE — Addendum Note (Signed)
Addended by: Ann Maki on: 08/04/2022 09:05 AM   Modules accepted: Orders

## 2022-08-11 ENCOUNTER — Other Ambulatory Visit: Payer: Self-pay

## 2022-08-11 DIAGNOSIS — K219 Gastro-esophageal reflux disease without esophagitis: Secondary | ICD-10-CM

## 2022-08-11 DIAGNOSIS — E7800 Pure hypercholesterolemia, unspecified: Secondary | ICD-10-CM

## 2022-08-11 DIAGNOSIS — G243 Spasmodic torticollis: Secondary | ICD-10-CM | POA: Diagnosis not present

## 2022-08-11 NOTE — Telephone Encounter (Signed)
Called and spoke with [facility], Dysport delivery is scheduled for 08/12/22.

## 2022-08-12 NOTE — Telephone Encounter (Signed)
Received 1 500 unit vial of Dysport from [facility].

## 2022-08-26 ENCOUNTER — Ambulatory Visit: Payer: BC Managed Care – PPO | Admitting: Neurology

## 2022-08-26 VITALS — BP 138/81 | HR 62 | Ht 68.0 in | Wt 206.0 lb

## 2022-08-26 DIAGNOSIS — G243 Spasmodic torticollis: Secondary | ICD-10-CM | POA: Diagnosis not present

## 2022-08-26 MED ORDER — ABOBOTULINUMTOXINA 500 UNITS IM SOLR: 500.0000 [IU] | Freq: Once | INTRAMUSCULAR | Status: AC

## 2022-08-26 NOTE — Progress Notes (Signed)
Dysport 500 units x 1 vial  Ndc-[address]-1 [id number] Exp-01/12/2024 SP

## 2022-08-26 NOTE — Progress Notes (Signed)
PATIENT: Andrew Bentley DOB: 11/20/1955  HISTORICAL  Andrew Bentley is a 66 years old right-handed Caucasian male, came back for revisit, for potential EMG guided Botox injection for his cervical dystonia  He had a gradual onset neck turning to the left side, abnormal neck posture since 2005, progressively worse since 2008, he was enrolled in Dysport research trial sponsored by [employer] since 2013, he totally received 4 injection, last injection was November 2013, complete the study successfully in April 2014, which is his last office visit.   During the trial, he receive EMG guided dysport injection every 3 months, 500 units, he tolerated the injection very well, showed significant improvement of his neck posturing  He continue to works out regularly, he denies gait difficulty, he complains of left occipital area radiating pain to his left parietal area, he denies radiating pain from his neck to his arms, and hands  UPDATE Jan 20th 2016: He signed a consent form, also documented under communication dated October 03 2014, potential side effect explained, he had a bulky right sternocleidomastoid, left posterior cervical paraspinal muscles.  UPDATE February 13 2015: He responded very well to previous injection in October 03 2014,he received 500 units of Dysport, he can move his neck better, no significant side effect, no neck extension weakness, no swallowing difficulties  He has almost constant head turning to the left side even at the peak benefit of the Dysport injection  UPDATE May 11th 2017: Last injection was in Sept 2016, he received Dysport 500 units times 2, he had transient dysphagia, and dysarthria lasting for couple weeks, otherwise injection was very helpful, he continued to exercise regularly, receiving chiropractic adjustment regularly  Update April 29 2016:  He responded very well to previous injection Dysport 500 unitsx2 in May 2017, did reported side effect of mild transient  swallowing difficulty, weak voice, difficulty extending his neck,  Update September 03 2016, He responded very well to previous injectionsignificant side effect notice,  UPDATE Jan 13 2017: He had returning of frequent neck no no shaking movement, forceful pushing towards the left side, also lean towards his left shoulder, he has right posterior neck pain  Update April 21 2017: Responded well to previous injection, he has significant posterior neck muscle atrophy, did complains of mild hoarse voice after injection  UPDATE Jul 23 2017: He respond well to previous injection, recent onset of low back pain  UPDATE Nov 03 2017: He has no significant side effect from previous injection, complains of left side of the neck muscle tension,  UPDATE Feb 02 2018: He responded very well to previous injection, no significant side effect noticed.  UPDATE May 12 2018: He is doing very well with his injection, workout regularly,  UPDATE Aug 31 2018: He responded very well to previous injection, complains of right posterior neck muscle tension  UPDATE Jan 25 2019: He responded very well to previous injection  UPDATE May 09 2019: He responded well to previous injection  UPDATE Aug 15 2019: He has mild left-sided neck pain,  UPDATE December 06 2019: He did well with previous injection, also receiving massage therapy  Update March 06, 2020: He has significant atrophy of left posterior cervical muscle, is receiving massage therapy, chiropractor regularly which has been helpful,  UPDATE Sept 27 2021: He responded very well to Dysport injection, no significant side effect noticed, along with massage, workout regularly, chiropractor, his symptoms overall has much improved, less muscle tightness, spasm  Update October 15, 2020: This is  4 months from previous injection, only recently he noticed left posterior neck muscle tightness, improved by stretching exercise, and physical therapy, we decided to  decrease his Dysport dose from 1000 to  500 units today  Update February 12, 2021: He responded well to previous Dysport injection, 500 units only, tolerating it well, like lower dose,  UPDATE Sept 7 2022: He responded well to previous injection, no significant side effect noted  Update November 10, 2021, He responded well to previous injection, continue to workout, massage therapy   Update August 26, 2022 Injection has helped his abnormal neck posturing and neck pain, today he is concerned about gradually increase neck titubation, his grandmother does suffer tremor, he denies significant hand tremor  REVIEW OF SYSTEMS: Full 14 system review of systems performed and notable only for as above  PHYSICAL EXAM   Vitals:   08/26/22 1408  BP: 138/81  Pulse: 62  Weight: 206 lb (93.4 kg)  Height: 5\' 8"  (1.727 m)     Body mass index is 31.32 kg/m.  DIAGNOSTIC DATA (LABS, IMAGING, TESTING) - I reviewed patient records, labs, notes, testing and imaging myself where available.  ASSESSMENT AND PLAN  Andrew Bentley is a 66 y.o. male with long-standing history of cervical dystonia, responded very well to previous EMG guided dysport injection.  He has moderate left turning, mild retrocollis, mild left shoulder elevation, mild right laterocollis, frequent small amplitude no-no head shaking, significant atrophy of left posterior neck muscle  Under EMG guidance, 500  units of Dysport were injected (500 /2.5 cc for total of 5.0 cc)  Right semispinalis 0.5 cc Left longissimus capitis 0.5 cc Left splenius capitis 0.5 cc Left splenius cervix 0.5 cc Left semispinalis 0.5 units  Return to clinic in 3 months  79, M.D. Ph.D.  [facility] [address], [address] [place], [place] [phone]

## 2022-09-01 ENCOUNTER — Other Ambulatory Visit: Payer: BC Managed Care – PPO | Admitting: Family

## 2022-09-01 ENCOUNTER — Other Ambulatory Visit: Payer: BC Managed Care – PPO

## 2022-09-01 DIAGNOSIS — E7800 Pure hypercholesterolemia, unspecified: Secondary | ICD-10-CM | POA: Diagnosis not present

## 2022-09-01 DIAGNOSIS — K219 Gastro-esophageal reflux disease without esophagitis: Secondary | ICD-10-CM | POA: Diagnosis not present

## 2022-09-02 LAB — CBC WITH DIFFERENTIAL/PLATELET
Absolute Monocytes: 395 cells/uL (ref 200–950)
Basophils Absolute: 42 cells/uL (ref 0–200)
Basophils Relative: 1 %
Eosinophils Absolute: 109 cells/uL (ref 15–500)
Eosinophils Relative: 2.6 %
HCT: 42.8 % (ref 38.5–50.0)
Hemoglobin: 14.6 g/dL (ref 13.2–17.1)
Lymphs Abs: 1130 cells/uL (ref 850–3900)
MCH: 31.1 pg (ref 27.0–33.0)
MCHC: 34.1 g/dL (ref 32.0–36.0)
MCV: 91.1 fL (ref 80.0–100.0)
MPV: 10.4 fL (ref 7.5–12.5)
Monocytes Relative: 9.4 %
Neutro Abs: 2524 cells/uL (ref 1500–7800)
Neutrophils Relative %: 60.1 %
Platelets: 289 10*3/uL (ref 140–400)
RBC: 4.7 10*6/uL (ref 4.20–5.80)
RDW: 12 % (ref 11.0–15.0)
Total Lymphocyte: 26.9 %
WBC: 4.2 10*3/uL (ref 3.8–10.8)

## 2022-09-02 LAB — LIPID PANEL
Cholesterol: 179 mg/dL (ref <200)
HDL: 68 mg/dL (ref >=40)
LDL Cholesterol (Calc): 97 mg/dL (calc)
Non-HDL Cholesterol (Calc): 111 mg/dL (calc) (ref <130)
Total CHOL/HDL Ratio: 2.6 (calc) (ref <5.0)
Triglycerides: 62 mg/dL (ref <150)

## 2022-09-02 LAB — COMPLETE METABOLIC PANEL WITH GFR
AG Ratio: 1.7 (calc) (ref 1.0–2.5)
ALT: 17 U/L (ref 9–46)
AST: 20 U/L (ref 10–35)
Albumin: 4.3 g/dL (ref 3.6–5.1)
Alkaline phosphatase (APISO): 55 U/L (ref 35–144)
BUN: 16 mg/dL (ref 7–25)
CO2: 27 mmol/L (ref 20–32)
Calcium: 9.4 mg/dL (ref 8.6–10.3)
Chloride: 103 mmol/L (ref 98–110)
Creat: 1.14 mg/dL (ref 0.70–1.35)
Globulin: 2.5 g/dL (calc) (ref 1.9–3.7)
Glucose, Bld: 91 mg/dL (ref 65–99)
Potassium: 4.5 mmol/L (ref 3.5–5.3)
Sodium: 139 mmol/L (ref 135–146)
Total Bilirubin: 0.8 mg/dL (ref 0.2–1.2)
Total Protein: 6.8 g/dL (ref 6.1–8.1)
eGFR: 72 mL/min/{1.73_m2} (ref >=60)

## 2022-09-03 ENCOUNTER — Encounter: Payer: Self-pay | Admitting: Family

## 2022-09-03 ENCOUNTER — Ambulatory Visit: Payer: BC Managed Care – PPO | Admitting: Family

## 2022-09-03 VITALS — BP 138/80 | HR 77 | Temp 97.8°F | Ht 69.0 in | Wt 211.0 lb

## 2022-09-03 DIAGNOSIS — Z6830 Body mass index (BMI) 30.0-30.9, adult: Secondary | ICD-10-CM | POA: Diagnosis not present

## 2022-09-03 DIAGNOSIS — K219 Gastro-esophageal reflux disease without esophagitis: Secondary | ICD-10-CM

## 2022-09-03 DIAGNOSIS — E7800 Pure hypercholesterolemia, unspecified: Secondary | ICD-10-CM | POA: Diagnosis not present

## 2022-09-03 NOTE — Progress Notes (Signed)
Provider: Marlowe Sax FNP-C   Amaia Lavallie, Nelda Bucks, NP  Patient Care Team: Neil Errickson, Nelda Bucks, NP as PCP - General (Family Medicine)  Extended Emergency Contact Information Primary Emergency Contact: [employer] Address: [address]          [place], [place] [address] Johnnette Litter of [place] Phone: [phone] Relation: Spouse Secondary Emergency Contact: Constance Holster Address: [address]          [place], [place] [address] Johnnette Litter of [place] Phone: [phone] Work Phone: [phone] Mobile Phone: [phone] Relation: Father  Code Status:  Full Code  Goals of care: Advanced Directive information    09/03/2022    8:46 AM  Advanced Directives  Does Patient Have a Medical Advance Directive? No  Would patient like information on creating a medical advance directive? Yes (MAU/Ambulatory/Procedural Areas - Information given)     Chief Complaint  Patient presents with   Medical Management of Chronic Issues    6 month follow-up and discuss labs (patient is active on Mychart) Discuss need for colonoscopy or post pone if patient refuses or is not a candidate.     HPI:  Pt is a 66 y.o. male seen today for 6 months follow up for medical management of chronic diseases.  Recent lab results reviewed previous high LDL has improved.  States has been exercising with a trainer at least twice per week walking on the treadmill, elliptical and also does weight training. Has had weight gain since last seen.  He plans to change his diet.  States he likes eating bread.  States tends to skip breakfast and lunch and then eats dinner.  Discussed well-balanced diet in the morning and lunch and light for dinner. Does not drink wine and every once a while he drinks a Beer or whiskey. He follows up with neurologist Dr. Hubbard Robinson for cervical dystonia. He was last seen 08/26/2022 for progressive left-sided neck dystonia.Currently being treated with EMG guided Botox  injection which she states has helped to slightly time the neck.  He does receive injection every 3 months.  He denies any weakness, numbness or tingling on the arms.  Past Medical History:  Diagnosis Date   Cervical dystonia    Transaminitis    Past Surgical History:  Procedure Laterality Date   TONSILLECTOMY  1963    No Known Allergies  Allergies as of 09/03/2022   No Known Allergies      Medication List        Accurate as of September 03, 2022  8:53 AM. If you have any questions, ask your nurse or doctor.          Dysport 500 units Solr injection Generic drug: AbobotulinumtoxinA Inject 500 Units into the muscle every 3 (three) months.        Review of Systems  Constitutional:  Negative for appetite change, chills, fatigue, fever and unexpected weight change.  HENT:  Negative for congestion, dental problem, ear discharge, ear pain, facial swelling, hearing loss, nosebleeds, postnasal drip, rhinorrhea, sinus pressure, sinus pain, sneezing, sore throat, tinnitus and trouble swallowing.   Eyes:  Negative for pain, discharge, redness, itching and visual disturbance.  Respiratory:  Negative for cough, chest tightness, shortness of breath and wheezing.   Cardiovascular:  Negative for chest pain, palpitations and leg swelling.  Gastrointestinal:  Negative for abdominal distention, abdominal pain, blood in stool, constipation, diarrhea, nausea and vomiting.  Endocrine: Negative for cold intolerance, heat intolerance, polydipsia, polyphagia and polyuria.  Genitourinary:  Negative for difficulty urinating, dysuria, flank pain, frequency and urgency.  Musculoskeletal:  Positive for neck pain. Negative for arthralgias, back pain, gait problem, joint swelling, myalgias and neck stiffness.  Skin:  Negative for color change, pallor, rash and wound.  Neurological:  Negative for dizziness, syncope, speech difficulty, weakness, light-headedness, numbness and headaches.  Hematological:   Does not bruise/bleed easily.  Psychiatric/Behavioral:  Negative for agitation, behavioral problems, confusion, hallucinations, self-injury, sleep disturbance and suicidal ideas. The patient is not nervous/anxious.     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 0 0 0 0 0  Was there an injury with Fall? 0 0 0 0 0  Fall Risk Category Calculator 0 0 0 0 0  Fall Risk Category _0   Patient Fall Risk Level _1   Patient at Risk for Falls Due to   No Fall Risks No Fall Risks No Fall Risks  Fall risk Follow up   Falls evaluation completed Falls evaluation completed Falls evaluation completed   Functional Status Survey:    Vitals:   09/03/22 0847  BP: 138/80  Pulse: 77  Temp: 97.8 F (36.6 C)  TempSrc: Temporal  SpO2: 97%  Weight: 211 lb (95.7 kg)  Height: _2  (1.753 m)   Body mass index is 31.16 kg/m. Physical Exam Vitals reviewed.  Constitutional:      General: He is not in acute distress.    Appearance: Normal appearance. He is obese. He is not ill-appearing or diaphoretic.  HENT:     Head: Normocephalic.     Right Ear: Tympanic membrane, ear canal and external ear normal. There is no impacted cerumen.     Left Ear: Tympanic membrane, ear canal and external ear normal. There is no impacted cerumen.     Nose: Nose normal. No congestion or rhinorrhea.     Mouth/Throat:     Mouth: Mucous membranes are moist.     Pharynx: Oropharynx is clear. No oropharyngeal exudate or posterior oropharyngeal erythema.  Eyes:      General: No scleral icterus.       Right eye: No discharge.        Left eye: No discharge.     Extraocular Movements: Extraocular movements intact.     Conjunctiva/sclera: Conjunctivae normal.     Pupils: Pupils are equal, round, and reactive to light.  Neck:     Vascular: No carotid bruit.     Comments: Limited range of motion turning neck to the right Cardiovascular:     Rate and Rhythm: Normal rate and regular rhythm.     Pulses: Normal pulses.     Heart sounds: Normal heart sounds. No murmur heard.    No friction rub. No gallop.  Pulmonary:     Effort: Pulmonary effort is normal. No respiratory distress.     Breath sounds: Normal breath sounds. No wheezing, rhonchi or rales.  Chest:     Chest wall: No  tenderness.  Abdominal:     General: Bowel sounds are normal. There is no distension.     Palpations: Abdomen is soft. There is no mass.     Tenderness: There is no abdominal tenderness. There is no right CVA tenderness, left CVA tenderness, guarding or rebound.  Musculoskeletal:        General: No swelling or tenderness. Normal range of motion.     Cervical back: No rigidity or tenderness.     Right lower leg: No edema.     Left lower leg: No edema.  Lymphadenopathy:     Cervical: No cervical adenopathy.  Skin:    General: Skin is warm and dry.     Coloration: Skin is not pale.     Findings: No bruising, erythema, lesion or rash.  Neurological:     Mental Status: He is alert and oriented to person, place, and time.     Cranial Nerves: No cranial nerve deficit.     Sensory: No sensory deficit.     Motor: No weakness.     Coordination: Coordination normal.     Gait: Gait normal.  Psychiatric:        Mood and Affect: Mood normal.        Speech: Speech normal.        Behavior: Behavior normal.        Thought Content: Thought content normal.        Judgment: Judgment normal.     Labs reviewed: Recent Labs    02/23/22 0827 09/01/22 0810  NA 140 139  K 5.1 4.5   CL 104 103  CO2 30 27  GLUCOSE 95 91  BUN 14 16  CREATININE 1.10 1.14  CALCIUM 9.5 9.4   Recent Labs    09/01/22 0810  AST 20  ALT 17  BILITOT 0.8  PROT 6.8   Recent Labs    09/01/22 0810  WBC 4.2  NEUTROABS 2,524  HGB 14.6  HCT 42.8  MCV 91.1  PLT 289   No results found for: "TSH" Lab Results  Component Value Date   HGBA1C 5.2 09/23/2020   Lab Results  Component Value Date   CHOL 179 09/01/2022   HDL 68 09/01/2022   LDLCALC 97 09/01/2022   TRIG 62 09/01/2022   CHOLHDL 2.6 09/01/2022    Significant Diagnostic Results in last 30 days:  No results found.  Assessment/Plan 1. Pure hypercholesterolemia LDL has improved -Continue with dietary modification and exercise - Lipid panel; Future  2. Gastroesophageal reflux disease without esophagitis Has symptoms every now and then but over-the-counter antiacid has been effective. - CBC with Differential/Platelet; Future - CMP with eGFR([facility]); Future  3. Body mass index (BMI) of 30.0-30.9 in adult Has gained weight since last visit with a BMI of 31.16 Dietary modification and exercise advised.Will attempt to reduce the amount of bread that he eats.  Family/ staff Communication: Reviewed plan of care with patient verbalized understanding  Labs/tests ordered:  - Lipid panel; Future - CBC with Differential/Platelet; Future - CMP with eGFR([facility]); Future  Next Appointment : Return in about 6 months (around 03/05/2023) for medical mangement of chronic issues., Fasting labs in 6 months prior to visit.   Sandrea Hughs, NP

## 2022-10-21 ENCOUNTER — Telehealth: Payer: Self-pay | Admitting: Neurology

## 2022-10-21 NOTE — Telephone Encounter (Signed)
Sent msg asking pt about insurance for 2024 

## 2022-11-02 NOTE — Telephone Encounter (Signed)
LVM asking pt for insurance info for 2024

## 2022-11-04 NOTE — Telephone Encounter (Signed)
LVM x2 asking pt for insurance

## 2022-11-09 NOTE — Telephone Encounter (Signed)
LVM x3 asking pt for insurance

## 2022-11-12 NOTE — Telephone Encounter (Signed)
Sent pt a text asking for insurance info.

## 2022-11-16 NOTE — Telephone Encounter (Signed)
Do you mind calling pt at some point today to see if we can get his insurance info? His Dysport appointment may end up having to be cancelled.

## 2022-11-17 ENCOUNTER — Other Ambulatory Visit: Payer: Self-pay

## 2022-11-17 ENCOUNTER — Other Ambulatory Visit (HOSPITAL_COMMUNITY): Payer: Self-pay

## 2022-11-17 ENCOUNTER — Telehealth: Payer: Self-pay | Admitting: Neurology

## 2022-11-17 DIAGNOSIS — G243 Spasmodic torticollis: Secondary | ICD-10-CM | POA: Diagnosis not present

## 2022-11-17 MED ORDER — DYSPORT 500 UNITS IM SOLR
500.0000 [IU] | INTRAMUSCULAR | 1 refills | Status: DC
  Filled 2022-11-17: qty 500, fill #0
  Filled 2023-04-13: qty 500, 30d supply, fill #0

## 2022-11-17 NOTE — Telephone Encounter (Signed)
Please send rx to [facility] asap

## 2022-11-17 NOTE — Telephone Encounter (Signed)
Pharmacy Patient Advocate Encounter   Received notification from [facility] that prior authorization for Dysport 500 units is required/requested.   PA submitted on 11/17/2022 to (ins) [employer] via [employer] or (Medicaid) confirmation # [id number] Status is pending

## 2022-11-17 NOTE — Telephone Encounter (Signed)
[facility], are you able to do auth asap for [employer]?

## 2022-11-17 NOTE — Telephone Encounter (Signed)
Refill sent.

## 2022-11-18 ENCOUNTER — Ambulatory Visit: Payer: BC Managed Care – PPO | Admitting: Neurology

## 2022-11-18 ENCOUNTER — Telehealth: Payer: Self-pay | Admitting: Neurology

## 2022-11-18 NOTE — Telephone Encounter (Signed)
Kristen from [facility] called needing to speak to the RN regarding the Prior Auth for the pt's Dysport. Please advise.

## 2022-11-18 NOTE — Telephone Encounter (Signed)
Called insurance back and there was already an approved PA on file. Approval was through 10.14.24. Confirmation [id number]. Insurance also stated to use [facility]

## 2022-11-18 NOTE — Telephone Encounter (Signed)
Is it possible for this pt to be worked in for his Dysport? He was cancelled because [facility] told us he needed a new prior auth, but according to [facility] he has one on file through 06/2023.

## 2022-11-19 NOTE — Telephone Encounter (Signed)
LVM and sent mychart informing pt of appt date/time.

## 2022-11-19 NOTE — Telephone Encounter (Signed)
Ok to add on schedule for injection for 3-13

## 2022-11-25 ENCOUNTER — Ambulatory Visit: Payer: BC Managed Care – PPO | Admitting: Neurology

## 2022-11-26 ENCOUNTER — Other Ambulatory Visit (HOSPITAL_COMMUNITY): Payer: Self-pay

## 2023-01-19 ENCOUNTER — Ambulatory Visit (INDEPENDENT_AMBULATORY_CARE_PROVIDER_SITE_OTHER): Payer: Medicare Other | Admitting: Neurology

## 2023-01-19 VITALS — BP 124/86 | HR 69 | Ht 69.75 in | Wt 205.0 lb

## 2023-01-19 DIAGNOSIS — G243 Spasmodic torticollis: Secondary | ICD-10-CM

## 2023-01-19 MED ORDER — ABOBOTULINUMTOXINA 500 UNITS IM SOLR
500.0000 [IU] | INTRAMUSCULAR | Status: DC
  Administered 2024-01-20: 500 [IU] via INTRAMUSCULAR

## 2023-01-19 NOTE — Progress Notes (Signed)
Dysport 500 units x 1 vial  LOT #: [id number] EXP: 06/13/2024 NDC#: [address]-4  Witnessed by Florentina Addison.  SP

## 2023-01-19 NOTE — Progress Notes (Signed)
PATIENT: Andrew Bentley DOB: 11/20/1955  HISTORICAL  Andrew Bentley is a 67 years old right-handed Caucasian male, came back for revisit, for potential EMG guided Botox injection for his cervical dystonia  He had a gradual onset neck turning to the left side, abnormal neck posture since 2005, progressively worse since 2008, he was enrolled in Dysport research trial sponsored by [employer] since 2013, he totally received 4 injection, last injection was November 2013, complete the study successfully in April 2014, which is his last office visit.   During the trial, he receive EMG guided dysport injection every 3 months, 500 units, he tolerated the injection very well, showed significant improvement of his neck posturing  He continue to works out regularly, he denies gait difficulty, he complains of left occipital area radiating pain to his left parietal area, he denies radiating pain from his neck to his arms, and hands  UPDATE Jan 20th 2016: He signed a consent form, also documented under communication dated October 03 2014, potential side effect explained, he had a bulky right sternocleidomastoid, left posterior cervical paraspinal muscles.  UPDATE February 13 2015: He responded very well to previous injection in October 03 2014,he received 500 units of Dysport, he can move his neck better, no significant side effect, no neck extension weakness, no swallowing difficulties  He has almost constant head turning to the left side even at the peak benefit of the Dysport injection  UPDATE May 11th 2017: Last injection was in Sept 2016, he received Dysport 500 units times 2, he had transient dysphagia, and dysarthria lasting for couple weeks, otherwise injection was very helpful, he continued to exercise regularly, receiving chiropractic adjustment regularly  Update April 29 2016:  He responded very well to previous injection Dysport 500 unitsx2 in May 2017, did reported side effect of mild transient  swallowing difficulty, weak voice, difficulty extending his neck,  Update September 03 2016, He responded very well to previous injectionsignificant side effect notice,  UPDATE Jan 13 2017: He had returning of frequent neck no no shaking movement, forceful pushing towards the left side, also lean towards his left shoulder, he has right posterior neck pain  Update April 21 2017: Responded well to previous injection, he has significant posterior neck muscle atrophy, did complains of mild hoarse voice after injection  UPDATE Jul 23 2017: He respond well to previous injection, recent onset of low back pain  UPDATE Nov 03 2017: He has no significant side effect from previous injection, complains of left side of the neck muscle tension,  UPDATE Feb 02 2018: He responded very well to previous injection, no significant side effect noticed.  UPDATE May 12 2018: He is doing very well with his injection, workout regularly,  UPDATE Aug 31 2018: He responded very well to previous injection, complains of right posterior neck muscle tension  UPDATE Jan 25 2019: He responded very well to previous injection  UPDATE May 09 2019: He responded well to previous injection  UPDATE Aug 15 2019: He has mild left-sided neck pain,  UPDATE December 06 2019: He did well with previous injection, also receiving massage therapy  Update March 06, 2020: He has significant atrophy of left posterior cervical muscle, is receiving massage therapy, chiropractor regularly which has been helpful,  UPDATE Sept 27 2021: He responded very well to Dysport injection, no significant side effect noticed, along with massage, workout regularly, chiropractor, his symptoms overall has much improved, less muscle tightness, spasm  Update October 15, 2020: This is  4 months from previous injection, only recently he noticed left posterior neck muscle tightness, improved by stretching exercise, and physical therapy, we decided to  decrease his Dysport dose from 1000 to  500 units today  Update February 12, 2021: He responded well to previous Dysport injection, 500 units only, tolerating it well, like lower dose,  UPDATE Sept 7 2022: He responded well to previous injection, no significant side effect noted  Update November 10, 2021, He responded well to previous injection, continue to workout, massage therapy   Update August 26, 2022 Injection has helped his abnormal neck posturing and neck pain, today he is concerned about gradually increase neck titubation, his grandmother does suffer tremor, he denies significant hand tremor  UPDATE Jan 19 2023: Prolonged interview from last injection, almost 5 months, he noticed left posterior neck muscle tension, more difficulty turning towards the right side  REVIEW OF SYSTEMS: Full 14 system review of systems performed and notable only for as above  PHYSICAL EXAM   Vitals:   01/19/23 1334  BP: 124/86  Pulse: 69  Weight: 205 lb (93 kg)  Height: 5' 9.75" (1.772 m)     Body mass index is 29.63 kg/m.  DIAGNOSTIC DATA (LABS, IMAGING, TESTING) - I reviewed patient records, labs, notes, testing and imaging myself where available.  ASSESSMENT AND PLAN  Andrew Bentley is a 67 y.o. male with long-standing history of cervical dystonia, responded very well to previous EMG guided dysport injection.  He has moderate left turning, mild retrocollis, mild left shoulder elevation, mild right laterocollis, frequent small amplitude no-no head shaking, significant hypertrophy of left posterior neck muscle  Under EMG guidance, 500  units of Dysport were injected (500 /2.5 cc for total of 5.0 cc)  Right sternocleidomastoid  0.5 cc Left longissimus capitis 0.5 cc Left splenius capitis 0.5 cc Left splenius cervix 0.5 cc Left semispinalis 0.5 units  Return to clinic in 3 months  Levert Feinstein, M.D. Ph.D.  [facility] [address] [place], [place]  [address] [phone]

## 2023-03-09 ENCOUNTER — Other Ambulatory Visit: Payer: Self-pay

## 2023-03-09 ENCOUNTER — Other Ambulatory Visit: Payer: 59

## 2023-03-09 DIAGNOSIS — E7800 Pure hypercholesterolemia, unspecified: Secondary | ICD-10-CM

## 2023-03-09 DIAGNOSIS — K219 Gastro-esophageal reflux disease without esophagitis: Secondary | ICD-10-CM

## 2023-03-30 ENCOUNTER — Other Ambulatory Visit: Payer: BC Managed Care – PPO

## 2023-04-01 ENCOUNTER — Ambulatory Visit: Payer: 59 | Admitting: Family

## 2023-04-13 ENCOUNTER — Other Ambulatory Visit (HOSPITAL_COMMUNITY): Payer: Self-pay

## 2023-04-13 NOTE — Telephone Encounter (Addendum)
I called [employer] and verified this is an active PA for [employer] and Annette Stable.  Call Reference: [id number]

## 2023-04-13 NOTE — Telephone Encounter (Signed)
Maxine Glenn, Valli Glance is out sick for the week. Can you please look into this? He is scheduled for a Dysport appointment on 8/1. This message is from [facility] who sets up our deliveries from [facility]: hey i just called the insurance and they said that PA is for the medical benefit not the pharmacy benefit! i am thinking the medication that was used back in may would have been buy and bill bc we have never gotten a paid claim. this is the case number if you need to follow up at all - [id number]

## 2023-04-15 ENCOUNTER — Ambulatory Visit (INDEPENDENT_AMBULATORY_CARE_PROVIDER_SITE_OTHER): Payer: BC Managed Care – PPO | Admitting: Neurology

## 2023-04-15 VITALS — BP 135/91 | HR 74

## 2023-04-15 DIAGNOSIS — G243 Spasmodic torticollis: Secondary | ICD-10-CM

## 2023-04-15 MED ORDER — ABOBOTULINUMTOXINA 500 UNITS IM SOLR
500.0000 [IU] | Freq: Once | INTRAMUSCULAR | Status: DC
Start: 2023-04-15 — End: 2023-04-19

## 2023-04-15 NOTE — Progress Notes (Signed)
PATIENT: Andrew Bentley DOB: 1956-04-09  HISTORICAL  Andrew Bentley is a 67 years old right-handed Caucasian male, came back for revisit, for potential EMG guided Botox injection for his cervical dystonia  He had a gradual onset neck turning to the left side, abnormal neck posture since 2005, progressively worse since 2008, he was enrolled in Dysport research trial sponsored by [employer] since 2013, he totally received 4 injection, last injection was November 2013, complete the study successfully in April 2014, which is his last office visit.   During the trial, he receive EMG guided dysport injection every 3 months, 500 units, he tolerated the injection very well, showed significant improvement of his neck posturing  He continue to works out regularly, he denies gait difficulty, he complains of left occipital area radiating pain to his left parietal area, he denies radiating pain from his neck to his arms, and hands  UPDATE Jan 20th 2016: He signed a consent form, also documented under communication dated October 03 2014, potential side effect explained, he had a bulky right sternocleidomastoid, left posterior cervical paraspinal muscles.  UPDATE February 13 2015: He responded very well to previous injection in October 03 2014,he received 500 units of Dysport, he can move his neck better, no significant side effect, no neck extension weakness, no swallowing difficulties  He has almost constant head turning to the left side even at the peak benefit of the Dysport injection  UPDATE May 11th 2017: Last injection was in Sept 2016, he received Dysport 500 units times 2, he had transient dysphagia, and dysarthria lasting for couple weeks, otherwise injection was very helpful, he continued to exercise regularly, receiving chiropractic adjustment regularly  Update April 29 2016:  He responded very well to previous injection Dysport 500 unitsx2 in May 2017, did reported side effect of mild transient  swallowing difficulty, weak voice, difficulty extending his neck,  Update September 03 2016, He responded very well to previous injectionsignificant side effect notice,  UPDATE Jan 13 2017: He had returning of frequent neck no no shaking movement, forceful pushing towards the left side, also lean towards his left shoulder, he has right posterior neck pain  Update April 21 2017: Responded well to previous injection, he has significant posterior neck muscle atrophy, did complains of mild hoarse voice after injection  UPDATE Jul 23 2017: He respond well to previous injection, recent onset of low back pain  UPDATE Nov 03 2017: He has no significant side effect from previous injection, complains of left side of the neck muscle tension,  UPDATE Feb 02 2018: He responded very well to previous injection, no significant side effect noticed.  UPDATE May 12 2018: He is doing very well with his injection, workout regularly,  UPDATE Aug 31 2018: He responded very well to previous injection, complains of right posterior neck muscle tension  UPDATE Jan 25 2019: He responded very well to previous injection  UPDATE May 09 2019: He responded well to previous injection  UPDATE Aug 15 2019: He has mild left-sided neck pain,  UPDATE December 06 2019: He did well with previous injection, also receiving massage therapy  Update March 06, 2020: He has significant atrophy of left posterior cervical muscle, is receiving massage therapy, chiropractor regularly which has been helpful,  UPDATE Sept 27 2021: He responded very well to Dysport injection, no significant side effect noticed, along with massage, workout regularly, chiropractor, his symptoms overall has much improved, less muscle tightness, spasm  Update October 15, 2020: This is  4 months from previous injection, only recently he noticed left posterior neck muscle tightness, improved by stretching exercise, and physical therapy, we decided to  decrease his Dysport dose from 1000 to  500 units today  Update February 12, 2021: He responded well to previous Dysport injection, 500 units only, tolerating it well, like lower dose,  UPDATE Sept 7 2022: He responded well to previous injection, no significant side effect noted  Update November 10, 2021, He responded well to previous injection, continue to workout, massage therapy   Update August 26, 2022 Injection has helped his abnormal neck posturing and neck pain, today he is concerned about gradually increase neck titubation, his grandmother does suffer tremor, he denies significant hand tremor  UPDATE Jan 19 2023: Prolonged interview from last injection, almost 5 months, he noticed left posterior neck muscle tension, more difficulty turning towards the right side  UPDATE April 15 2023: He did well with previous injection,  REVIEW OF SYSTEMS: Full 14 system review of systems performed and notable only for as above  PHYSICAL EXAM   Vitals:   04/15/23 1309 04/15/23 1313  BP: (!) 153/87 (!) 135/91  Pulse: 74      There is no height or weight on file to calculate BMI.  DIAGNOSTIC DATA (LABS, IMAGING, TESTING) - I reviewed patient records, labs, notes, testing and imaging myself where available.  ASSESSMENT AND PLAN  Andrew Bentley is a 67 y.o. male with long-standing history of cervical dystonia, responded very well to previous EMG guided dysport injection.  He has moderate left turning, mild retrocollis, mild left shoulder elevation, mild right laterocollis, frequent small amplitude no-no head shaking, significant hypertrophy of left posterior neck muscle  Under EMG guidance, 500  units of Dysport were injected (500 /2.5 cc for total of 5.0 cc)  Left longissimus capitis 0.5 cc Left splenius capitis 0.5 cc Left splenius cervix 0.5 cc Left semispinalis 0.5 units  Left Splenius capitis 0.5 cc  Return to clinic in 3 months  Levert Feinstein, M.D. Ph.D.  [facility] [address] [place], [place] [address] [phone]

## 2023-04-15 NOTE — Progress Notes (Signed)
Dysport 500units x 1 vial  [id number] Exp-1.31.26 B/B  Bacteriostatic 0.9% Sodium Chloride- 2.5 mL  Lot: [id number] Expiration: 04.01.2025 NDC: [id number] Dx: G24.3 WITNESSED [id number], CMA

## 2023-04-19 MED ORDER — ABOBOTULINUMTOXINA 500 UNITS IM SOLR
500.0000 [IU] | Freq: Once | INTRAMUSCULAR | Status: AC
Start: 2023-04-19 — End: 2023-04-19
  Administered 2023-04-19: 500 [IU] via INTRAMUSCULAR

## 2023-04-30 ENCOUNTER — Other Ambulatory Visit: Payer: Medicare Other

## 2023-04-30 DIAGNOSIS — E7800 Pure hypercholesterolemia, unspecified: Secondary | ICD-10-CM | POA: Diagnosis not present

## 2023-04-30 DIAGNOSIS — K219 Gastro-esophageal reflux disease without esophagitis: Secondary | ICD-10-CM | POA: Diagnosis not present

## 2023-05-01 LAB — COMPLETE METABOLIC PANEL WITH GFR

## 2023-05-04 ENCOUNTER — Ambulatory Visit: Payer: Medicare Other | Admitting: Family

## 2023-05-06 ENCOUNTER — Encounter: Payer: Self-pay | Admitting: Family

## 2023-05-06 ENCOUNTER — Ambulatory Visit (INDEPENDENT_AMBULATORY_CARE_PROVIDER_SITE_OTHER): Payer: BC Managed Care – PPO | Admitting: Family

## 2023-05-06 VITALS — BP 126/80 | HR 70 | Temp 97.8°F | Resp 17 | Ht 69.0 in | Wt 210.0 lb

## 2023-05-06 DIAGNOSIS — K219 Gastro-esophageal reflux disease without esophagitis: Secondary | ICD-10-CM | POA: Diagnosis not present

## 2023-05-06 DIAGNOSIS — G243 Spasmodic torticollis: Secondary | ICD-10-CM | POA: Diagnosis not present

## 2023-05-06 DIAGNOSIS — Z6830 Body mass index (BMI) 30.0-30.9, adult: Secondary | ICD-10-CM

## 2023-05-06 DIAGNOSIS — E7800 Pure hypercholesterolemia, unspecified: Secondary | ICD-10-CM

## 2023-05-06 NOTE — Progress Notes (Signed)
Provider: Richarda Blade FNP-C   Emil Klassen, Donalee Citrin, NP  Patient Care Team: Zanyiah Posten, Donalee Citrin, NP as PCP - General (Family Medicine)  Extended Emergency Contact Information Primary Emergency Contact: [facility] Address: [address]          [place], [place] [address] Darden Amber of [place] Phone: [phone] Relation: Spouse Secondary Emergency Contact: Regino Bellow Address: [address]          [place], [place] [address] Darden Amber of [place] Home Phone: [phone] Work Phone: [phone] Mobile Phone: [phone] Relation: Father  Code Status:  Full Code  Goals of care: Advanced Directive information    05/06/2023    9:36 AM  Advanced Directives  Does Patient Have a Medical Advance Directive? No  Does patient want to make changes to medical advance directive? No - Patient declined     Chief Complaint  Patient presents with   Medical Management of Chronic Issues    Patient is being seen for 6 month follow up   Health Maintenance    Patient is due for colonoscopy    Immunizations    Patient is due for pneumonia vaccine     HPI:  Pt is a 67 y.o. male seen today for 6 months for medical management of chronic diseases.   Continues with dysport injection with Dr.Yan Yijun for cervical dystonia.states still has slight limited ROM turning to the left to look over the shoulder  GERD - occasional when he eats something that does not agree with stomach.No blood in the stool.  Brought in supplement high in veggies that he recently started for provider to evaluate.   Past Medical History:  Diagnosis Date   Cervical dystonia    Transaminitis    Past Surgical History:  Procedure Laterality Date   TONSILLECTOMY  1963    No Known Allergies  Allergies as of 05/06/2023   No Known Allergies      Medication List        Accurate as of May 06, 2023  9:47 AM. If you have any questions, ask your nurse or doctor.          Dysport 500 units  Solr injection Generic drug: AbobotulinumtoxinA Inject 500 Units into the muscle every 3 (three) months.        Review of Systems  Constitutional:  Negative for appetite change, chills, fatigue, fever and unexpected weight change.  HENT:  Positive for tinnitus. Negative for congestion, dental problem, ear discharge, ear pain, facial swelling, hearing loss, nosebleeds, postnasal drip, rhinorrhea, sinus pressure, sinus pain, sneezing, sore throat and trouble swallowing.   Eyes:  Negative for pain, discharge, redness, itching and visual disturbance.  Respiratory:  Negative for cough, chest tightness, shortness of breath and wheezing.   Cardiovascular:  Negative for chest pain, palpitations and leg swelling.  Gastrointestinal:  Negative for abdominal distention, abdominal pain, blood in stool, constipation, diarrhea, nausea and vomiting.  Endocrine: Negative for cold intolerance, heat intolerance, polydipsia, polyphagia and polyuria.  Genitourinary:  Negative for difficulty urinating, dysuria, flank pain, frequency and urgency.  Musculoskeletal:  Negative for arthralgias, back pain, gait problem, joint swelling, myalgias, neck pain and neck stiffness.  Skin:  Negative for color change, pallor, rash and wound.  Neurological:  Negative for dizziness, syncope, speech difficulty, weakness, light-headedness, numbness and headaches.  Hematological:  Does not bruise/bleed easily.  Psychiatric/Behavioral:  Negative for agitation, behavioral problems, confusion, hallucinations, self-injury, sleep disturbance and suicidal ideas. The patient is not nervous/anxious.  Immunization History  Administered Date(s) Administered   Janssen (J&J) SARS-COV-2 Vaccination 12/14/2019   Pertinent  Health Maintenance Due  Topic Date Due   Colonoscopy  Never done   INFLUENZA VACCINE  Discontinued      09/26/2020    9:39 AM 08/26/2021    1:09 PM 02/25/2022    1:04 PM 09/03/2022    8:46 AM 05/06/2023    9:36 AM   Fall Risk  Falls in the past year? 0 0 0 0 0  Was there an injury with Fall? 0 0 0 0 0  Fall Risk Category Calculator 0 0 0 0 0  Fall Risk Category (Retired) Low Low Low Low   (RETIRED) Patient Fall Risk Level Low fall risk Low fall risk Low fall risk Low fall risk   Patient at Risk for Falls Due to  No Fall Risks No Fall Risks No Fall Risks No Fall Risks  Fall risk Follow up  Falls evaluation completed Falls evaluation completed Falls evaluation completed Falls evaluation completed   Functional Status Survey:    Vitals:   05/06/23 0934  BP: 126/80  Pulse: 70  Resp: 17  Temp: 97.8 F (36.6 C)  TempSrc: Temporal  SpO2: 97%  Weight: 210 lb (95.3 kg)  Height: 5\' 9"  (1.753 m)   Body mass index is 31.01 kg/m. Physical Exam Vitals reviewed.  Constitutional:      General: He is not in acute distress.    Appearance: Normal appearance. He is obese. He is not ill-appearing or diaphoretic.  HENT:     Head: Normocephalic.     Right Ear: Tympanic membrane, ear canal and external ear normal. There is no impacted cerumen.     Left Ear: Tympanic membrane, ear canal and external ear normal. There is no impacted cerumen.     Nose: Nose normal. No congestion or rhinorrhea.     Mouth/Throat:     Mouth: Mucous membranes are moist.     Pharynx: Oropharynx is clear. No oropharyngeal exudate or posterior oropharyngeal erythema.  Eyes:     General: No scleral icterus.       Right eye: No discharge.        Left eye: No discharge.     Extraocular Movements: Extraocular movements intact.     Conjunctiva/sclera: Conjunctivae normal.     Pupils: Pupils are equal, round, and reactive to light.  Neck:     Vascular: No carotid bruit.  Cardiovascular:     Rate and Rhythm: Normal rate and regular rhythm.     Pulses: Normal pulses.     Heart sounds: Normal heart sounds. No murmur heard.    No friction rub. No gallop.  Pulmonary:     Effort: Pulmonary effort is normal. No respiratory distress.      Breath sounds: Normal breath sounds. No wheezing, rhonchi or rales.  Chest:     Chest wall: No tenderness.  Abdominal:     General: Bowel sounds are normal. There is no distension.     Palpations: Abdomen is soft. There is no mass.     Tenderness: There is no abdominal tenderness. There is no right CVA tenderness, left CVA tenderness, guarding or rebound.  Musculoskeletal:        General: No swelling or tenderness. Normal range of motion.     Cervical back: Normal range of motion. No rigidity or tenderness.     Right lower leg: No edema.     Left lower leg: No edema.  Lymphadenopathy:  Cervical: No cervical adenopathy.  Skin:    General: Skin is warm and dry.     Coloration: Skin is not pale.     Findings: No bruising, erythema, lesion or rash.  Neurological:     Mental Status: He is alert and oriented to person, place, and time.     Cranial Nerves: No cranial nerve deficit.     Sensory: No sensory deficit.     Motor: No weakness.     Coordination: Coordination normal.     Gait: Gait normal.  Psychiatric:        Mood and Affect: Mood normal.        Speech: Speech normal.        Behavior: Behavior normal.        Thought Content: Thought content normal.        Judgment: Judgment normal.    Labs reviewed: Recent Labs    09/01/22 0810 04/30/23 0816  NA 139 139  K 4.5 4.8  CL 103 104  CO2 27 29  GLUCOSE 91 93  BUN 16 14  CREATININE 1.14 1.08  CALCIUM 9.4 9.6   Recent Labs    09/01/22 0810 04/30/23 0816  AST 20 21  ALT 17 17  BILITOT 0.8 0.5  PROT 6.8 6.8   Recent Labs    09/01/22 0810 04/30/23 0816  WBC 4.2 4.6  NEUTROABS 2,524 3,004  HGB 14.6 13.7  HCT 42.8 41.3  MCV 91.1 92.8  PLT 289 309   No results found for: "TSH" Lab Results  Component Value Date   HGBA1C 5.2 09/23/2020   Lab Results  Component Value Date   CHOL 186 04/30/2023   HDL 69 04/30/2023   LDLCALC 100 (H) 04/30/2023   TRIG 80 04/30/2023   CHOLHDL 2.7 04/30/2023     Significant Diagnostic Results in last 30 days:  No results found.  Assessment/Plan 1. Pure hypercholesterolemia LDL slightly high  - continue dietary modification and exercise  - Lipid panel; Future  2. Gastroesophageal reflux disease without esophagitis Symptoms controlled. H/H stable.No tarry or black stool  - advised to avoid eating meals late in the evening and to avoid aggravating foods and spices. - continue on OTC antiacids  - COMPLETE METABOLIC PANEL WITH GFR; Future - CBC with Differential/Platelet; Future  3. Cervical dystonia Continue to follow up with Dr.Yun.   4. Body mass index (BMI) of 30.0-30.9 in adult BMI 31.01 with comorbid such as Hypercholesterolemia.  - continue dietary modification and exercise   Family/ staff Communication: Reviewed plan of care with patient  Labs/tests ordered:  - Lipid panel; Future - COMPLETE METABOLIC PANEL WITH GFR; Future - CBC with Differential/Platelet; Future  Next Appointment : Return in about 6 months (around 11/06/2023) for medical mangement of chronic issues., Fasting labs in 6 months prior to visit.   Caesar Bookman, NP

## 2023-06-22 NOTE — Telephone Encounter (Signed)
Completed [employer] continuation form and placed in nurse pod for MD signature.

## 2023-06-30 NOTE — Telephone Encounter (Signed)
Faxed signed continuation form along with OV notes to [facility].

## 2023-07-05 NOTE — Telephone Encounter (Signed)
Received fax of approval from [facility].  Berkley Harvey: [phone] (06/30/23-05/31/24)

## 2023-07-14 ENCOUNTER — Ambulatory Visit: Payer: Medicare Other | Admitting: Neurology

## 2023-07-14 VITALS — BP 129/83 | HR 68

## 2023-07-14 DIAGNOSIS — G243 Spasmodic torticollis: Secondary | ICD-10-CM

## 2023-07-14 MED ORDER — INCOBOTULINUMTOXINA 100 UNITS IM SOLR
500.0000 [IU] | INTRAMUSCULAR | Status: DC
Start: 2023-07-14 — End: 2023-07-14

## 2023-07-14 NOTE — Progress Notes (Signed)
Dysport 500units x 1 vial  [id number] Exp-02.28.2026 B/B Bacteriostatic 0.9% Sodium Chloride- 2.5 mL  Lot: [id number] Expiration: 04.01.2025 NDC: [id number] Dx: G24.3 WITNESSED [id number], CMA

## 2023-07-14 NOTE — Progress Notes (Signed)
    PATIENT: Andrew Bentley DOB: June 29, 1956  HISTORICAL  Andrew Bentley is a 67 years old right-handed Caucasian male, came back for revisit, for potential EMG guided Botox injection for his cervical dystonia  He had a gradual onset neck turning to the left side, abnormal neck posture since 2005, progressively worse since 2008, he was enrolled in Dysport research trial sponsored by [employer] since 2013, he totally received 4 injection, last injection was November 2013, complete the study successfully in April 2014, which is his last office visit.   During the trial, he receive EMG guided dysport injection every 3 months, 500 units, he tolerated the injection very well, showed significant improvement of his neck posturing  He continue to works out regularly, he denies gait difficulty, he complains of left occipital area radiating pain to his left parietal area, he denies radiating pain from his neck to his arms, and hands  He has been doing very well with every 3 months injection, continued on Dysport 500 units  REVIEW OF SYSTEMS: Full 14 system review of systems performed and notable only for as above  PHYSICAL EXAM   Vitals:   07/14/23 1100  BP: 129/83  Pulse: 68     There is no height or weight on file to calculate BMI.  DIAGNOSTIC DATA (LABS, IMAGING, TESTING) - I reviewed patient records, labs, notes, testing and imaging myself where available.  ASSESSMENT AND PLAN  Andrew Bentley is a 67 y.o. male with long-standing history of cervical dystonia, responded very well to previous EMG guided dysport injection.  He has moderate left turning, mild retrocollis, mild left shoulder elevation, mild right laterocollis, frequent small amplitude no-no head shaking, significant hypertrophy of left posterior neck muscle  Under EMG guidance, 500  units of Dysport were injected (500 /2.5 cc for total of 5.0 cc)  Left longissimus capitis 0.5 cc Left splenius capitis 0.5 cc Left splenius cervix 0.5  cc Left semispinalis 0.5 units  Left sternocleidomastoid 0.5 cc  Return to clinic in 3 months for repeat injection  Andrew Bentley, M.D. Ph.D.  [facility] [address] [place], [place] [address] [phone]

## 2023-07-15 MED ORDER — ABOBOTULINUMTOXINA 500 UNITS IM SOLR
500.0000 [IU] | Freq: Once | INTRAMUSCULAR | Status: AC
Start: 2023-07-15 — End: ?

## 2023-07-15 NOTE — Addendum Note (Signed)
Addended by: Deatra James on: 07/15/2023 08:40 AM   Modules accepted: Orders

## 2023-10-06 ENCOUNTER — Ambulatory Visit: Payer: Medicare Other | Admitting: Neurology

## 2023-10-20 ENCOUNTER — Ambulatory Visit: Payer: BC Managed Care – PPO | Admitting: Neurology

## 2023-10-20 VITALS — BP 134/82

## 2023-10-20 DIAGNOSIS — G243 Spasmodic torticollis: Secondary | ICD-10-CM | POA: Diagnosis not present

## 2023-10-20 MED ORDER — ABOBOTULINUMTOXINA 500 UNITS IM SOLR
500.0000 [IU] | Freq: Once | INTRAMUSCULAR | Status: AC
Start: 2023-10-20 — End: 2023-10-20
  Administered 2023-10-20: 500 [IU] via INTRAMUSCULAR

## 2023-10-20 NOTE — Progress Notes (Signed)
    PATIENT: Andrew Bentley DOB: April 23, 1956  HISTORICAL  Andrew Bentley is a 68 years old right-handed Caucasian male, came back for revisit, for potential EMG guided Botox injection for his cervical dystonia  He had a gradual onset neck turning to the left side, abnormal neck posture since 2005, progressively worse since 2008, he was enrolled in Dysport  research trial sponsored by [employer] since 2013, he totally received 4 injection, last injection was November 2013, complete the study successfully in April 2014, which is his last office visit.   During the trial, he receive EMG guided dysport  injection every 3 months, 500 units, he tolerated the injection very well, showed significant improvement of his neck posturing  He continue to works out regularly, he denies gait difficulty, he complains of left occipital area radiating pain to his left parietal area, he denies radiating pain from his neck to his arms, and hands  He has been doing very well with every 3 months injection, continued on Dysport  500 units  REVIEW OF SYSTEMS: Full 14 system review of systems performed and notable only for as above  PHYSICAL EXAM   Vitals:   10/20/23 1059  BP: 134/82      ASSESSMENT AND PLAN  Andrew Bentley is a 68 y.o. male with long-standing history of cervical dystonia, responded very well to previous EMG guided dysport  injection.  He has moderate left turning, mild retrocollis, mild left shoulder elevation, mild right laterocollis, frequent small amplitude no-no head shaking, significant hypertrophy of left posterior neck muscle  Under EMG guidance, 500  units of Dysport  were injected (500 /2.5 cc for total of 5.0 cc)  Left longissimus capitis 0.5 cc Left splenius capitis 0.5 cc Left splenius cervix 0.5 cc Left semispinalis 0.5 units  Left sternocleidomastoid 0.5 cc  Return to clinic in 3 months for repeat injection  Modena Callander, M.D. Ph.D.  [facility] [address] [place], [place] [address] [phone]

## 2023-10-20 NOTE — Progress Notes (Signed)
 Dysport  500units x 1 vial  [phone] [id number] Exp-06/13/25 B/B or S/P Bacteriostatic 0.9% Sodium Chloride- 2.5 mL  Lot: [id number] Expiration: 07/15/24 NDC: [id number] Dx:  Cervical dystonia  - Primary G24.3    WITNESSED [id number] CMA

## 2023-11-08 ENCOUNTER — Other Ambulatory Visit: Payer: Medicare Other

## 2023-11-08 DIAGNOSIS — K219 Gastro-esophageal reflux disease without esophagitis: Secondary | ICD-10-CM

## 2023-11-08 DIAGNOSIS — E7800 Pure hypercholesterolemia, unspecified: Secondary | ICD-10-CM

## 2023-11-09 LAB — CBC WITH DIFFERENTIAL/PLATELET
Absolute Lymphocytes: 972 {cells}/uL (ref 850–3900)
Absolute Monocytes: 332 {cells}/uL (ref 200–950)
Basophils Absolute: 40 {cells}/uL (ref 0–200)
Basophils Relative: 1 %
Eosinophils Absolute: 120 {cells}/uL (ref 15–500)
Eosinophils Relative: 3 %
HCT: 42.6 % (ref 38.5–50.0)
Hemoglobin: 14.4 g/dL (ref 13.2–17.1)
MCH: 31.2 pg (ref 27.0–33.0)
MCHC: 33.8 g/dL (ref 32.0–36.0)
MCV: 92.2 fL (ref 80.0–100.0)
MPV: 10.4 fL (ref 7.5–12.5)
Monocytes Relative: 8.3 %
Neutro Abs: 2536 {cells}/uL (ref 1500–7800)
Neutrophils Relative %: 63.4 %
Platelets: 278 10*3/uL (ref 140–400)
RBC: 4.62 10*6/uL (ref 4.20–5.80)
RDW: 12.6 % (ref 11.0–15.0)
Total Lymphocyte: 24.3 %
WBC: 4 10*3/uL (ref 3.8–10.8)

## 2023-11-09 LAB — COMPLETE METABOLIC PANEL WITH GFR
AG Ratio: 1.8 (calc) (ref 1.0–2.5)
ALT: 15 U/L (ref 9–46)
AST: 19 U/L (ref 10–35)
Albumin: 4.4 g/dL (ref 3.6–5.1)
Alkaline phosphatase (APISO): 49 U/L (ref 35–144)
BUN: 17 mg/dL (ref 7–25)
CO2: 29 mmol/L (ref 20–32)
Calcium: 9.3 mg/dL (ref 8.6–10.3)
Chloride: 102 mmol/L (ref 98–110)
Creat: 1.05 mg/dL (ref 0.70–1.35)
Globulin: 2.4 g/dL (ref 1.9–3.7)
Glucose, Bld: 92 mg/dL (ref 65–99)
Potassium: 4.7 mmol/L (ref 3.5–5.3)
Sodium: 138 mmol/L (ref 135–146)
Total Bilirubin: 0.4 mg/dL (ref 0.2–1.2)
Total Protein: 6.8 g/dL (ref 6.1–8.1)
eGFR: 79 mL/min/{1.73_m2} (ref >=60)

## 2023-11-09 LAB — LIPID PANEL
Cholesterol: 188 mg/dL (ref <200)
HDL: 74 mg/dL (ref >=40)
LDL Cholesterol (Calc): 99 mg/dL
Non-HDL Cholesterol (Calc): 114 mg/dL (ref <130)
Total CHOL/HDL Ratio: 2.5 (calc) (ref <5.0)
Triglycerides: 67 mg/dL (ref <150)

## 2023-11-11 ENCOUNTER — Ambulatory Visit: Payer: Medicare Other | Admitting: Family

## 2023-11-19 ENCOUNTER — Encounter: Payer: Self-pay | Admitting: Family

## 2023-11-19 ENCOUNTER — Ambulatory Visit (INDEPENDENT_AMBULATORY_CARE_PROVIDER_SITE_OTHER): Payer: Medicare Other | Admitting: Family

## 2023-11-19 VITALS — BP 128/80 | HR 65 | Temp 97.2°F | Resp 21 | Ht 69.0 in | Wt 214.4 lb

## 2023-11-19 DIAGNOSIS — Z6831 Body mass index (BMI) 31.0-31.9, adult: Secondary | ICD-10-CM

## 2023-11-19 DIAGNOSIS — E7800 Pure hypercholesterolemia, unspecified: Secondary | ICD-10-CM | POA: Diagnosis not present

## 2023-11-19 DIAGNOSIS — R040 Epistaxis: Secondary | ICD-10-CM

## 2023-11-19 DIAGNOSIS — G243 Spasmodic torticollis: Secondary | ICD-10-CM

## 2023-11-19 DIAGNOSIS — K219 Gastro-esophageal reflux disease without esophagitis: Secondary | ICD-10-CM | POA: Diagnosis not present

## 2023-11-19 DIAGNOSIS — J302 Other seasonal allergic rhinitis: Secondary | ICD-10-CM | POA: Diagnosis not present

## 2023-11-19 DIAGNOSIS — H9313 Tinnitus, bilateral: Secondary | ICD-10-CM

## 2023-11-28 DIAGNOSIS — H9313 Tinnitus, bilateral: Secondary | ICD-10-CM | POA: Insufficient documentation

## 2023-11-28 NOTE — Progress Notes (Signed)
 Provider: Richarda Blade FNP-C   Kmarion Rawl, Donalee Citrin, NP  Patient Care Team: Page Lancon, Donalee Citrin, NP as PCP - General (Family Medicine)  Extended Emergency Contact Information Primary Emergency Contact: Ridge,Maryann Address: [address]          [place], [place] [address] Darden [place] of [place] Phone: [phone] Relation: Spouse Secondary Emergency Contact: Regino Bellow Address: [address]          [place], [place] [address] Darden Amber of [place] Home Phone: [phone] Work Phone: [phone] Mobile Phone: [phone] Relation: Father  Code Status:  FULL CODE Goals of care: Advanced Directive information    11/19/2023    9:20 AM  Advanced Directives  Does Patient Have a Medical Advance Directive? No  Would patient like information on creating a medical advance directive? No - Patient declined     Chief Complaint  Patient presents with   Medical Management of Chronic Issues    6 month follow up chronic. Discuss the need for colonoscopy.    Discussed the use of AI scribe software for clinical note transcription with the patient, who gave verbal consent to proceed.  History of Present Illness   He presents for a six-month follow-up visit.  He experiences symptoms of seasonal allergies, including watery eyes and a runny nose, which are more pronounced when he is outside, likely due to pollen exposure. He uses Claritin to manage these symptoms. No nasal congestion, swelling, or inflammation in the nose. No pain in the sinuses or forehead.  He has a history of epistaxis, particularly in dry environments like [facility], where he is planning to travel. He acknowledges not drinking enough water and considers using saline to moisten his nasal passages. He tends to blow his nose hard, which sometimes leads to epistaxis.  He experiences occasional heartburn, which he associates with drinking white wine. He uses Pepcid chewables as needed to alleviate  symptoms.  He notes a slight weight gain, with his current weight at 214.4 pounds, up from 210 pounds at the last visit. He attributes this to dietary habits, including increased consumption of potatoes and possibly fast food, and acknowledges not eating as he should due to a busy schedule. He continues to work out regularly, three times a week with a trainer, and tracks his activity using a fitness tracker.  He continues to receive Botox injections for neck muscle tightness and reports improvement in neck movement, as noted by his massage therapist.  He experiences tinnitus, which he notices more in quiet environments. No changes in vision or dental health, although he is overdue for an eye exam. He sleeps well at night, although he snores, which irritates his wife. No numbness, tingling, or pain in the legs. No anxiety or depression.         Past Medical History:  Diagnosis Date   Cervical dystonia    Transaminitis    Past Surgical History:  Procedure Laterality Date   TONSILLECTOMY  1963    No Known Allergies  Allergies as of 11/19/2023   No Known Allergies      Medication List        Accurate as of November 19, 2023 11:59 PM. If you have any questions, ask your nurse or doctor.          Dysport 500 units Solr injection Generic drug: AbobotulinumtoxinA Inject 500 Units into the muscle every 3 (three) months.        Review of Systems  Constitutional:  Negative for appetite  change, chills, fatigue, fever and unexpected weight change.  HENT:  Negative for congestion, dental problem, ear discharge, ear pain, facial swelling, hearing loss, nosebleeds, postnasal drip, rhinorrhea, sinus pressure, sinus pain, sneezing, sore throat, tinnitus and trouble swallowing.        Seasonal allergies   Eyes:  Negative for pain, discharge, redness, itching and visual disturbance.  Respiratory:  Negative for cough, chest tightness, shortness of breath and wheezing.   Cardiovascular:   Negative for chest pain, palpitations and leg swelling.  Gastrointestinal:  Negative for abdominal distention, abdominal pain, blood in stool, constipation, diarrhea, nausea and vomiting.       Occasional heart burn   Endocrine: Negative for cold intolerance, heat intolerance, polydipsia, polyphagia and polyuria.  Genitourinary:  Negative for difficulty urinating, dysuria, flank pain, frequency and urgency.  Musculoskeletal:  Positive for neck pain. Negative for arthralgias, back pain, gait problem, joint swelling, myalgias and neck stiffness.       Chronic neck pain   Skin:  Negative for color change, pallor, rash and wound.  Neurological:  Negative for dizziness, syncope, speech difficulty, weakness, light-headedness, numbness and headaches.  Hematological:  Does not bruise/bleed easily.  Psychiatric/Behavioral:  Negative for agitation, behavioral problems, confusion, hallucinations, self-injury, sleep disturbance and suicidal ideas. The patient is not nervous/anxious.     Immunization History  Administered Date(s) Administered   Janssen (J&J) SARS-COV-2 Vaccination 12/14/2019   Pertinent  Health Maintenance Due  Topic Date Due   Colonoscopy  12/03/2023 (Originally 12/29/2002)   INFLUENZA VACCINE  Discontinued      08/26/2021    1:09 PM 02/25/2022    1:04 PM 09/03/2022    8:46 AM 05/06/2023    9:36 AM 11/19/2023    9:21 AM  Fall Risk  Falls in the past year? 0 0 0 0   Was there an injury with Fall? 0 0 0 0 0  Fall Risk Category Calculator 0 0 0 0   Fall Risk Category (Retired) Low Low Low    (RETIRED) Patient Fall Risk Level Low fall risk Low fall risk Low fall risk    Patient at Risk for Falls Due to No Fall Risks No Fall Risks No Fall Risks No Fall Risks No Fall Risks  Fall risk Follow up Falls evaluation completed Falls evaluation completed Falls evaluation completed Falls evaluation completed Falls evaluation completed   Functional Status Survey:    Vitals:   11/19/23 0828   BP: 128/80  Pulse: 65  Resp: (!) 21  Temp: (!) 97.2 F (36.2 C)  SpO2: 100%  Weight: 214 lb 6.4 oz (97.3 kg)  Height: 5\' 9"  (1.753 m)   Body mass index is 31.66 kg/m. Physical Exam VITALS: T- 97.2, BP- 128/80, SaO2- 100% MEASUREMENTS: Weight- 214.4. GENERAL: Alert, cooperative, well developed, no acute distress. HEENT: Normocephalic, normal oropharynx, moist mucous membranes. Tympanic membranes clear bilaterally. Nasal mucosa without swelling or inflammation. No sinus tenderness. CHEST: Clear to auscultation bilaterally. No wheezes, rhonchi, or crackles. CARDIOVASCULAR: Normal heart rate and rhythm. S1 and S2 normal without murmurs. ABDOMEN: Soft, non-tender, non-distended, without organomegaly. Normal bowel sounds. Liver non-palpable. EXTREMITIES: No cyanosis or edema. NEUROLOGICAL: Cranial nerves grossly intact. Pupils equal, round, and reactive to light. Moves all extremities without gross motor or sensory deficit. SKIN: No rash, No lesion or erythema  PSYCHIATY / BEHAVIORAL: Mood stable      Labs reviewed: Recent Labs    04/30/23 0816 11/08/23 0813  NA 139 138  K 4.8 4.7  CL 104 102  CO2 29 29  GLUCOSE 93 92  BUN 14 17  CREATININE 1.08 1.05  CALCIUM 9.6 9.3   Recent Labs    04/30/23 0816 11/08/23 0813  AST 21 19  ALT 17 15  BILITOT 0.5 0.4  PROT 6.8 6.8   Recent Labs    04/30/23 0816 11/08/23 0813  WBC 4.6 4.0  NEUTROABS 3,004 2,536  HGB 13.7 14.4  HCT 41.3 42.6  MCV 92.8 92.2  PLT 309 278   No results found for: "TSH" Lab Results  Component Value Date   HGBA1C 5.2 09/23/2020   Lab Results  Component Value Date   CHOL 188 11/08/2023   HDL 74 11/08/2023   LDLCALC 99 11/08/2023   TRIG 67 11/08/2023   CHOLHDL 2.5 11/08/2023    Significant Diagnostic Results in last 30 days:  No results found.  Assessment/Plan  Allergic Rhinitis Mild symptoms with watery eyes and rhinorrhea, likely due to seasonal allergies, primarily occurring  outdoors. Loratadine is effective but may cause nasal dryness, especially in dry environments like [facility]. - Continue loratadine once daily as needed - Consider saline nasal spray for congestion - Increase hydration to prevent dryness  Epistaxis Occasional epistaxis, particularly in dry climates like [facility]. Increased hydration and nasal moisture are recommended to prevent dryness and bleeding. - Increase hydration - Use saline nasal spray to maintain nasal moisture - Avoid forceful nose blowing  Gastroesophageal Reflux Disease (GERD) Intermittent heartburn, likely exacerbated by dietary choices such as wine consumption. Uses famotidine chewables as needed for symptom relief. Advised to consider dietary modifications to reduce heartburn triggers. - Continue famotidine chewables as needed - Consider dietary modifications to reduce heartburn triggers  Obesity BMI 31.66  Weight increased by approximately 4 pounds since the last visit, current weight is 214.4 pounds. Acknowledges dietary habits contributing to weight gain, such as increased potato consumption. Regular exercise is maintained, but dietary adjustments are needed. - Encourage reduction of starchy foods like potatoes - Promote increased intake of lean proteins such as fish and chicken - Continue regular exercise regimen  Cervical Dystonia Ongoing management with Botox injections. Reports improvement in neck muscle tightness and range of motion. Massage therapy has shown significant improvement in muscle flexibility. - Continue Botox injections as per current regimen - Consider regular massage therapy to maintain muscle flexibility  Tinnitus Persistent tinnitus, particularly noticeable in quiet environments. No current treatment available. Advised to use background noise or music to mask tinnitus in quiet environments. - Use background noise or music to mask tinnitus in quiet environments   Family/ staff  Communication: Reviewed plan of care with patient verbalized understanding   Labs/tests ordered: - CBC with Differential/Platelet - CMP with eGFR([facility]) - Lipid panel  Next Appointment : Return in about 6 months (around 05/21/2024) for medical mangement of chronic issues., Fasting labs in 6 months prior to visit.   Spent 30 minutes of Face to face and non-face to face with patient  >50% time spent counseling; reviewing medical record; tests; labs; documentation and developing future plan of care.   Caesar Bookman, NP

## 2024-01-05 ENCOUNTER — Telehealth: Payer: Self-pay | Admitting: Neurology

## 2024-01-05 NOTE — Telephone Encounter (Signed)
 LVM and sent mychart msg informing pt of r/s needed for [facility] appt on 4/30- MD out.

## 2024-01-12 ENCOUNTER — Ambulatory Visit: Payer: Medicare Other | Admitting: Neurology

## 2024-01-20 ENCOUNTER — Ambulatory Visit: Admitting: Neurology

## 2024-01-20 VITALS — BP 131/92

## 2024-01-20 DIAGNOSIS — G243 Spasmodic torticollis: Secondary | ICD-10-CM

## 2024-01-20 MED ORDER — ABOBOTULINUMTOXINA 500 UNITS IM SOLR: 500.0000 [IU] | Freq: Once | INTRAMUSCULAR | Status: AC

## 2024-01-20 NOTE — Progress Notes (Signed)
 Dysport  500units x 1 vial  [phone] [id number] Exp-07/14/25 B/B  Bacteriostatic 0.9% Sodium Chloride- 2.5 mL  Lot: [id number] Expiration: 07/15/24 NDC: 2951884166 Dx: G24.3  WITNESSED BY:A Rochelle Chu

## 2024-01-20 NOTE — Progress Notes (Signed)
    PATIENT: Andrew Bentley DOB: 1956/08/26  HISTORICAL  Andrew Bentley is a 68 years old right-handed Caucasian male, came back for revisit, for potential EMG guided Botox injection for his cervical dystonia  He had a gradual onset neck turning to the left side, abnormal neck posture since 2005, progressively worse since 2008, he was enrolled in Dysport  research trial sponsored by [employer] since 2013, he totally received 4 injection, last injection was November 2013, complete the study successfully in April 2014, which is his last office visit.   During the trial, he receive EMG guided dysport  injection every 3 months, 500 units, he tolerated the injection very well, showed significant improvement of his neck posturing  He continue to works out regularly, he denies gait difficulty, he complains of left occipital area radiating pain to his left parietal area, he denies radiating pain from his neck to his arms, and hands  He has been doing very well with every 3 months injection, continued on Dysport  500 units  REVIEW OF SYSTEMS: Full 14 system review of systems performed and notable only for as above  PHYSICAL EXAM   Vitals:   01/20/24 1603 01/20/24 1608  BP: (!) 152/84 (!) 131/92      ASSESSMENT AND PLAN  Andrew Bentley is a 68 y.o. male with long-standing history of cervical dystonia, responded very well to previous EMG guided dysport  injection.  He has moderate left turning, mild retrocollis, mild left shoulder elevation, mild right laterocollis, frequent small amplitude no-no head shaking, significant hypertrophy of left posterior neck muscle  Under EMG guidance, 500  units of Dysport  were injected (500 /2.5 cc for total of 5.0 cc)  Left longissimus capitis 0.5 cc Left splenius capitis 0.5 cc Left splenius cervix 0.5 cc Left semispinalis 0.5 units  right sternocleidomastoid 0.5 cc  Return to clinic in 3 months for repeat injection  Phebe Brasil, M.D. Ph.D.  [facility] [address] [place], [place] [address] [phone]

## 2024-04-19 ENCOUNTER — Ambulatory Visit: Admitting: Neurology

## 2024-04-19 VITALS — Ht 69.0 in | Wt 211.0 lb

## 2024-04-19 DIAGNOSIS — G243 Spasmodic torticollis: Secondary | ICD-10-CM

## 2024-04-19 MED ORDER — ABOBOTULINUMTOXINA 500 UNITS IM SOLR
500.0000 [IU] | Freq: Once | INTRAMUSCULAR | Status: AC
Start: 2024-04-19 — End: 2024-04-22
  Administered 2024-04-22: 500 [IU] via INTRAMUSCULAR

## 2024-04-19 NOTE — Progress Notes (Signed)
 Dysport  500units x 1 vial  [id number] [phone] Exp-10/14/2025 B/B  Bacteriostatic 0.9% Sodium Chloride- 2.5 mL  Lot: [id number] Expiration: 07/14/25 NDC: [id number] Dx: G24.3  WITNESSED BY:a jones rn

## 2024-04-19 NOTE — Progress Notes (Signed)
    PATIENT: Lani Mendiola DOB: [birth year]  HISTORICAL  Child Campoy is a 68 years old right-handed Caucasian male, came back for revisit, for potential EMG guided Botox injection for his cervical dystonia  He had a gradual onset neck turning to the left side, abnormal neck posture since 2005, progressively worse since 2008, he was enrolled in Dysport  research trial sponsored by [employer] since 2013, he totally received 4 injection, last injection was November 2013, complete the study successfully in April 2014, which is his last office visit.   During the trial, he receive EMG guided dysport  injection every 3 months, 500 units, he tolerated the injection very well, showed significant improvement of his neck posturing  He continue to works out regularly, he denies gait difficulty, he complains of left occipital area radiating pain to his left parietal area, he denies radiating pain from his neck to his arms, and hands  He has been doing very well with every 3 months injection, continued on Dysport  500 units  REVIEW OF SYSTEMS: Full 14 system review of systems performed and notable only for as above  PHYSICAL EXAM   Vitals:   04/19/24 1520  Weight: 211 lb (95.7 kg)  Height: 5' 9 (1.753 m)      ASSESSMENT AND PLAN  Anees Vanecek is a 68 y.o. male with long-standing history of cervical dystonia, responded very well to previous EMG guided dysport  injection.  He has moderate left turning, mild retrocollis, mild left shoulder elevation, mild right laterocollis, frequent small amplitude no-no head shaking, significant hypertrophy of left posterior neck muscle  Under EMG guidance, 500  units of Dysport  were injected (500 /2.5 cc for total of 5.0 cc)  Left longissimus capitis 0.5 cc Left splenius capitis 0.5 cc Left splenius cervix 0.5 cc Left semispinalis 0.5 units  right sternocleidomastoid 0.5 cc  Return to clinic in 3 months for repeat injection  Modena Callander, M.D. Ph.D.  [facility] [address], [address] [place], [place] [address] [phone]

## 2024-04-22 DIAGNOSIS — G243 Spasmodic torticollis: Secondary | ICD-10-CM

## 2024-05-22 ENCOUNTER — Other Ambulatory Visit

## 2024-05-22 DIAGNOSIS — E7800 Pure hypercholesterolemia, unspecified: Secondary | ICD-10-CM

## 2024-05-22 DIAGNOSIS — K219 Gastro-esophageal reflux disease without esophagitis: Secondary | ICD-10-CM

## 2024-05-24 ENCOUNTER — Ambulatory Visit: Admitting: Family

## 2024-05-24 ENCOUNTER — Ambulatory Visit: Payer: Self-pay | Admitting: Family

## 2024-05-24 ENCOUNTER — Encounter: Payer: Self-pay | Admitting: Family

## 2024-05-24 VITALS — BP 136/84 | HR 80 | Temp 97.7°F | Resp 19 | Ht 69.0 in | Wt 208.0 lb

## 2024-05-24 DIAGNOSIS — R739 Hyperglycemia, unspecified: Secondary | ICD-10-CM

## 2024-05-24 DIAGNOSIS — E7800 Pure hypercholesterolemia, unspecified: Secondary | ICD-10-CM | POA: Diagnosis not present

## 2024-05-24 DIAGNOSIS — J302 Other seasonal allergic rhinitis: Secondary | ICD-10-CM

## 2024-05-24 DIAGNOSIS — K219 Gastro-esophageal reflux disease without esophagitis: Secondary | ICD-10-CM

## 2024-05-24 DIAGNOSIS — G243 Spasmodic torticollis: Secondary | ICD-10-CM

## 2024-05-25 LAB — LIPID PANEL
Cholesterol: 205 mg/dL — ABNORMAL HIGH (ref <200)
HDL: 81 mg/dL (ref >=40)
LDL Cholesterol (Calc): 107 mg/dL — ABNORMAL HIGH
Non-HDL Cholesterol (Calc): 124 mg/dL (ref <130)
Total CHOL/HDL Ratio: 2.5 (calc) (ref <5.0)
Triglycerides: 76 mg/dL (ref <150)

## 2024-05-25 LAB — CBC WITH DIFFERENTIAL/PLATELET
Absolute Lymphocytes: 596 {cells}/uL — ABNORMAL LOW (ref 850–3900)
Absolute Monocytes: 431 {cells}/uL (ref 200–950)
Basophils Absolute: 30 {cells}/uL (ref 0–200)
Basophils Relative: 0.5 %
Eosinophils Absolute: 130 {cells}/uL (ref 15–500)
Eosinophils Relative: 2.2 %
HCT: 43.7 % (ref 38.5–50.0)
Hemoglobin: 14.2 g/dL (ref 13.2–17.1)
MCH: 31.6 pg (ref 27.0–33.0)
MCHC: 32.5 g/dL (ref 32.0–36.0)
MCV: 97.1 fL (ref 80.0–100.0)
MPV: 10.3 fL (ref 7.5–12.5)
Monocytes Relative: 7.3 %
Neutro Abs: 4714 {cells}/uL (ref 1500–7800)
Neutrophils Relative %: 79.9 %
Platelets: 247 Thousand/uL (ref 140–400)
RBC: 4.5 Million/uL (ref 4.20–5.80)
RDW: 12.5 % (ref 11.0–15.0)
Total Lymphocyte: 10.1 %
WBC: 5.9 Thousand/uL (ref 3.8–10.8)

## 2024-05-25 LAB — COMPREHENSIVE METABOLIC PANEL WITH GFR
AG Ratio: 1.9 (calc) (ref 1.0–2.5)
ALT: 17 U/L (ref 9–46)
AST: 20 U/L (ref 10–35)
Albumin: 4.5 g/dL (ref 3.6–5.1)
Alkaline phosphatase (APISO): 47 U/L (ref 35–144)
BUN: 16 mg/dL (ref 7–25)
CO2: 29 mmol/L (ref 20–32)
Calcium: 9.4 mg/dL (ref 8.6–10.3)
Chloride: 101 mmol/L (ref 98–110)
Creat: 1.03 mg/dL (ref 0.70–1.35)
Globulin: 2.4 g/dL (ref 1.9–3.7)
Glucose, Bld: 103 mg/dL — ABNORMAL HIGH (ref 65–99)
Potassium: 4.8 mmol/L (ref 3.5–5.3)
Sodium: 138 mmol/L (ref 135–146)
Total Bilirubin: 0.8 mg/dL (ref 0.2–1.2)
Total Protein: 6.9 g/dL (ref 6.1–8.1)
eGFR: 80 mL/min/1.73m2 (ref >=60)

## 2024-05-25 LAB — HEMOGLOBIN A1C
Hgb A1c MFr Bld: 5.2 % (ref <5.7)
Mean Plasma Glucose: 103 mg/dL
eAG (mmol/L): 5.7 mmol/L

## 2024-05-25 LAB — TEST AUTHORIZATION

## 2024-05-28 NOTE — Progress Notes (Signed)
 Provider: Roxan Plough FNP-C   Alvaretta Eisenberger, Roxan BROCKS, NP  Patient Care Team: Mayco Walrond, Roxan BROCKS, NP as PCP - General (Family Medicine)  Extended Emergency Contact Information Primary Emergency Contact: [facility] Address: [address] [place], [place] [address] [place] Mobile Phone: [phone] Relation: Spouse Secondary Emergency Contact: Lyell Martinis Address: [address]          [place], [place] [address] [place] Home Phone: [phone] Work Phone: [phone] Mobile Phone: [phone] Relation: Father  Code Status: Full code Goals of care: Advanced Directive information    05/24/2024    9:09 AM  Advanced Directives  Does Patient Have a Medical Advance Directive? No  Would patient like information on creating a medical advance directive? No - Patient declined     Chief Complaint  Patient presents with   Medical Management of Chronic Issues    6 Month follow up and discuss colonoscopy& Pneumococcal vaccine.     Discussed the use of AI scribe software for clinical note transcription with the patient, who gave verbal consent to proceed.  History of Present Illness   Andrew Bentley is a 68 year old male who presents for a six-month follow-up visit.  His glucose levels have increased from 92 mg/dL to 896 mg/dL over the past six months. He attributes this rise to dietary habits, including consuming high-carbohydrate foods like loaded baked potatoes. Increased travel has disrupted his exercise routine, though he attempts to work out three times a week when in town. He consumes wine, believing it to be low in sugar, but is uncertain about its actual sugar content.  His cholesterol levels have increased, with total cholesterol rising from 188 mg/dL to 794 mg/dL. He attributes this to his diet, which includes high-fat foods like cheese and steak. His LDL cholesterol has increased from 99 mg/dL to 892 mg/dL, while triglycerides have  risen slightly from 67 mg/dL to 76 mg/dL. He acknowledges the need to modify his diet to manage these levels.  He recently experienced symptoms including sinus drainage and a temperature on Monday, which have since resolved. He no longer feels achy and has resumed normal activities, including exercise. He has not tested for COVID-19 but has had it before without significant respiratory issues. No current fever, achiness, or significant cough. No issues with urination or prostate concerns.  He has a history of cervical dystonia for which he receives injections, and he experiences tinnitus, which he has learned to live with. He does not take any regular medications or vitamins but uses Tums or Pepto for occasional acid reflux.  He visits his father daily in assisted living and interacts with other residents. He is mindful of not visiting when feeling unwell to prevent spreading illness.    Past Medical History:  Diagnosis Date   Cervical dystonia    Transaminitis    Past Surgical History:  Procedure Laterality Date   TONSILLECTOMY  1963    No Known Allergies  Allergies as of 05/24/2024   No Known Allergies      Medication List        Accurate as of May 24, 2024 11:59 PM. If you have any questions, ask your nurse or doctor.          Dysport  500 units Solr injection Generic drug: AbobotulinumtoxinA  Inject 500 Units into the muscle every 3 (three) months.        Review of Systems  Constitutional:  Negative  for appetite change, chills, fatigue, fever and unexpected weight change.  HENT:  Negative for dental problem, ear discharge, ear pain, facial swelling, hearing loss, nosebleeds, postnasal drip, sinus pressure, sinus pain, sneezing, sore throat, tinnitus and trouble swallowing.        Recent URI symptoms have improved   Eyes:  Negative for pain, discharge, redness, itching and visual disturbance.  Respiratory:  Negative for chest tightness, shortness of breath and  wheezing.        Recent cough has improved   Cardiovascular:  Negative for chest pain, palpitations and leg swelling.  Gastrointestinal:  Negative for abdominal distention, abdominal pain, blood in stool, constipation, diarrhea, nausea and vomiting.  Endocrine: Negative for cold intolerance, heat intolerance, polydipsia, polyphagia and polyuria.  Genitourinary:  Negative for difficulty urinating, dysuria, flank pain, frequency and urgency.  Musculoskeletal:  Negative for arthralgias, back pain, gait problem, joint swelling, myalgias and neck stiffness.       Follows up Neurologist with for cervical dystonia injection   Skin:  Negative for color change, pallor, rash and wound.  Neurological:  Negative for dizziness, syncope, speech difficulty, weakness, light-headedness, numbness and headaches.  Hematological:  Does not bruise/bleed easily.  Psychiatric/Behavioral:  Negative for agitation, behavioral problems, confusion, hallucinations, self-injury, sleep disturbance and suicidal ideas. The patient is not nervous/anxious.     Immunization History  Administered Date(s) Administered   Janssen (J&J) SARS-COV-2 Vaccination 12/14/2019   Pertinent  Health Maintenance Due  Topic Date Due   Colonoscopy  05/24/2025 (Originally 12/29/2002)   Influenza Vaccine  Discontinued      02/25/2022    1:04 PM 09/03/2022    8:46 AM 05/06/2023    9:36 AM 11/19/2023    9:21 AM 05/24/2024    9:08 AM  Fall Risk  Falls in the past year? 0 0 0  0  Was there an injury with Fall? 0 0 0 0 0  Fall Risk Category Calculator 0 0 0  0  Fall Risk Category (Retired) Low  Low      (RETIRED) Patient Fall Risk Level Low fall risk  Low fall risk      Patient at Risk for Falls Due to No Fall Risks No Fall Risks No Fall Risks No Fall Risks No Fall Risks  Fall risk Follow up Falls evaluation completed  Falls evaluation completed  Falls evaluation completed Falls evaluation completed Falls evaluation completed     Data saved  with a previous flowsheet row definition   Functional Status Survey:    Vitals:   05/24/24 0911  BP: 136/84  Pulse: 80  Resp: 19  Temp: 97.7 F (36.5 C)  SpO2: 96%  Weight: 208 lb (94.3 kg)  Height: 5' 9 (1.753 m)   Body mass index is 30.72 kg/m. Physical Exam  VITALS: P- 80, BP- 136/84 MEASUREMENTS: Weight- 208. GENERAL: Alert, cooperative, well developed, no acute distress. HEENT: Normocephalic, normal oropharynx, moist mucous membranes, eardrums normal, nose without swelling, throat normal, no sinus tenderness. NECK: Limited range of motion turning right. CHEST: Clear to auscultation bilaterally, no wheezes, rhonchi, or crackles. CARDIOVASCULAR: Normal heart rate and rhythm, S1 and S2 normal without murmurs. ABDOMEN: Soft, non-tender, non-distended, without organomegaly, normal bowel sounds. EXTREMITIES: No cyanosis or edema, sensation intact in legs. MUSCULOSKELETAL: Back non-tender. NEUROLOGICAL: Cranial nerves grossly intact, moves all extremities without gross motor or sensory deficit.  SKIN: No rash,no lesion or erythema   PSYCHIATRY/BEHAVIORAL: Mood stable    Labs reviewed: Recent Labs    11/08/23 0813  05/22/24 0840  NA 138 138  K 4.7 4.8  CL 102 101  CO2 29 29  GLUCOSE 92 103*  BUN 17 16  CREATININE 1.05 1.03  CALCIUM 9.3 9.4   Recent Labs    11/08/23 0813 05/22/24 0840  AST 19 20  ALT 15 17  BILITOT 0.4 0.8  PROT 6.8 6.9   Recent Labs    11/08/23 0813 05/22/24 0840  WBC 4.0 5.9  NEUTROABS 2,536 4,714  HGB 14.4 14.2  HCT 42.6 43.7  MCV 92.2 97.1  PLT 278 247   No results found for: TSH Lab Results  Component Value Date   HGBA1C 5.2 05/22/2024   Lab Results  Component Value Date   CHOL 205 (H) 05/22/2024   HDL 81 05/22/2024   LDLCALC 107 (H) 05/22/2024   TRIG 76 05/22/2024   CHOLHDL 2.5 05/22/2024    Significant Diagnostic Results in last 30 days:  No results found.  Assessment/Plan  Hypercholesterolemia Cholesterol  levels have increased since the last visit, with total cholesterol rising from 188 to 205 mg/dL and LDL cholesterol increasing from 99 to 107 mg/dL. HDL cholesterol remains favorable due to regular exercise. Triglycerides have slightly increased from 67 to 76 mg/dL but remain below the target of 150 mg/dL. The increase in cholesterol is attributed to dietary habits, including high intake of saturated fats and cholesterol-rich foods. - Advise dietary modifications to reduce intake of saturated fats and cholesterol-rich foods. - Encourage increased consumption of fruits, vegetables, and lean proteins such as [place] and salmon. - Discuss potential need for statin therapy if cholesterol levels do not improve with lifestyle changes.  Impaired fasting glucose Fasting glucose level is 103 mg/dL, elevated compared to previous levels of 91-93 mg/dL, likely due to recent dietary choices, including high carbohydrate intake. - Order hemoglobin A1c to assess average blood glucose levels over the past three months. - Advise dietary changes to reduce carbohydrate intake, particularly from high-starch foods like potatoes and white bread. - Encourage regular physical activity to help manage glucose levels.  Viral upper respiratory infection (resolving) Symptoms include sinus drainage and a fever earlier in the week, which has resolved. Lymphocyte count is low, indicating a viral infection. Symptoms are improving, with no current fever or significant aches. - Advise continued hydration and rest. - Recommend Claritin for runny nose and Mucinex if coughing persists. - Advise against visiting his father if experiencing fever or significant symptoms.  Allergic rhinitis Experiencing sinus drainage and mild allergy symptoms without significant exacerbation. - Recommend Claritin for allergy symptom management.  Cervical dystonia (spasmodic torticollis) Chronic condition managed with regular injections. - Continue with  regular injections for cervical dystonia management.  General Health Maintenance Discussion on dietary habits and exercise to improve overall health. No current use of vitamins or supplements. - Recommend starting a multivitamin. - Encourage regular exercise and stretching before and after workouts.   Family/ staff Communication: Reviewed plan of care with patient verbalized understanding  Labs/tests ordered:  - CBC with Differential/Platelet - CMP with eGFR([facility]) - TSH - Hgb A1C - Lipid panel   Next Appointment : Return in about 6 months (around 11/21/2024) for medical mangement of chronic issues., Fasting labs in 6 months prior to visit.   Spent 30 minutes of Face to face and non-face to face with patient  >50% time spent counseling; reviewing medical record; tests; labs; documentation and developing future plan of care.   Roxan JAYSON Plough, NP

## 2024-06-22 ENCOUNTER — Telehealth: Payer: Self-pay | Admitting: Neurology

## 2024-06-22 DIAGNOSIS — G243 Spasmodic torticollis: Secondary | ICD-10-CM

## 2024-06-22 NOTE — Telephone Encounter (Signed)
 Submitted auth renewal request to [facility], if approved he will fill through [facility].  Pending case # [id number]

## 2024-06-26 MED ORDER — DYSPORT 500 UNITS IM SOLR: 500.0000 [IU] | INTRAMUSCULAR | 1 refills | Status: AC

## 2024-06-26 NOTE — Addendum Note (Signed)
 Addended by: JOSHUA IZETTA CROME on: 06/26/2024 01:27 PM   Modules accepted: Orders

## 2024-06-26 NOTE — Telephone Encounter (Signed)
 Auth was approved, please send rx to [facility].  Auth#: [id number] (06/22/24-05/24/25)

## 2024-07-11 NOTE — Telephone Encounter (Signed)
 Dysport  package was marked as delivered by [employer] but we did not receive it. I called [employer] and they launched an internal investigation. I also called [facility] and spoke with a pharmacist, she took the information and their replacement team will work on getting a new shipment out.

## 2024-07-11 NOTE — Telephone Encounter (Signed)
 [employer] brought the package about an hour after originally marked as delivered.

## 2024-07-18 ENCOUNTER — Telehealth: Payer: Self-pay | Admitting: Neurology

## 2024-07-18 NOTE — Telephone Encounter (Signed)
 [facility] conf

## 2024-07-20 ENCOUNTER — Ambulatory Visit: Admitting: Neurology

## 2024-07-20 VITALS — BP 160/90

## 2024-07-20 DIAGNOSIS — G243 Spasmodic torticollis: Secondary | ICD-10-CM | POA: Diagnosis not present

## 2024-07-20 MED ORDER — ABOBOTULINUMTOXINA 500 UNITS IM SOLR: 500.0000 [IU] | Freq: Once | INTRAMUSCULAR | Status: AC

## 2024-07-20 NOTE — Progress Notes (Signed)
 Dysport  500units x 1 vial  [phone] [id number] Exp-12/12/25  S/P Bacteriostatic 0.9% Sodium Chloride- 2.5 mL  Lot: [id number] Expiration: 07/14/25 NDC: 9590803397 Dx: G24.3  WITNESSED BY:j webb ma

## 2024-07-20 NOTE — Progress Notes (Signed)
    PATIENT: Andrew Bentley DOB: 04/25/1956  HISTORICAL  Andrew Bentley is a 69 years old right-handed Caucasian male, came back for revisit, for potential EMG guided Botox injection for his cervical dystonia  He had a gradual onset neck turning to the left side, abnormal neck posture since 2005, progressively worse since 2008, he was enrolled in Dysport  research trial sponsored by [employer] since 2013, he totally received 4 injection, last injection was November 2013, complete the study successfully in April 2014, which is his last office visit.   During the trial, he receive EMG guided dysport  injection every 3 months, 500 units, he tolerated the injection very well, showed significant improvement of his neck posturing  He continue to works out regularly, he denies gait difficulty, he complains of left occipital area radiating pain to his left parietal area, he denies radiating pain from his neck to his arms, and hands  He has been doing very well with every 3 months injection, continued on Dysport  500 units, work out and massage has helped as well.  REVIEW OF SYSTEMS: Full 14 system review of systems performed and notable only for as above  PHYSICAL EXAM   Vitals:   07/20/24 1308 07/20/24 1316  BP: (!) 148/91 (!) 160/90      ASSESSMENT AND PLAN  Andrew Bentley is a 68 y.o. male with long-standing history of cervical dystonia, responded very well to previous EMG guided dysport  injection.  He has moderate left turning, mild retrocollis, mild left shoulder elevation, mild right laterocollis, frequent small amplitude no-no head shaking, significant hypertrophy of left posterior neck muscle  Under EMG guidance, 500  units of Dysport  were injected (500 /2.5 cc for total of 5.0 cc)  Left longissimus capitis 0.5 cc Left splenius capitis 0.5 cc Left splenius cervix 0.5 cc Left semispinalis 0.5 units  right sternocleidomastoid 0.5 cc  Return to clinic in 3 months for repeat injection  Andrew Bentley, M.D. Ph.D.  [facility] [address], [address] [place], [place] [address] [phone]

## 2024-10-18 ENCOUNTER — Ambulatory Visit: Admitting: Neurology

## 2024-11-01 ENCOUNTER — Ambulatory Visit: Admitting: Neurology

## 2024-12-04 ENCOUNTER — Other Ambulatory Visit: Payer: Self-pay

## 2024-12-06 ENCOUNTER — Ambulatory Visit: Payer: Self-pay | Admitting: Family
